# Patient Record
Sex: Female | Born: 1955 | Race: White | Hispanic: No | Marital: Married | Smoking: Never smoker
Health system: Southern US, Community
[De-identification: ages and names within clinical notes are randomized; demographics above are authoritative.]

## PROBLEM LIST (undated history)

## (undated) HISTORY — PX: BUNIONECTOMY: SHX129

## (undated) HISTORY — PX: DILATION AND CURETTAGE OF UTERUS: SHX78

## (undated) HISTORY — PX: OTHER SURGICAL HISTORY: SHX169

## (undated) HISTORY — PX: ABDOMINAL HYSTERECTOMY: SHX81

## (undated) HISTORY — PX: BREAST BIOPSY: SHX20

---

## 1999-09-28 ENCOUNTER — Observation Stay (HOSPITAL_COMMUNITY): Admission: RE | Admit: 1999-09-28 | Discharge: 1999-09-29 | Payer: Self-pay | Admitting: *Deleted

## 2003-03-28 LAB — HM DEXA SCAN: HM DEXA SCAN: NORMAL

## 2004-01-09 ENCOUNTER — Ambulatory Visit: Payer: Self-pay | Admitting: General Surgery

## 2004-03-17 ENCOUNTER — Ambulatory Visit: Payer: Self-pay

## 2004-06-04 ENCOUNTER — Ambulatory Visit: Payer: Self-pay | Admitting: General Surgery

## 2005-11-01 DIAGNOSIS — Z8711 Personal history of peptic ulcer disease: Secondary | ICD-10-CM | POA: Insufficient documentation

## 2006-02-08 ENCOUNTER — Ambulatory Visit: Payer: Self-pay

## 2007-04-13 ENCOUNTER — Ambulatory Visit: Payer: Self-pay | Admitting: General Surgery

## 2007-04-17 ENCOUNTER — Ambulatory Visit: Payer: Self-pay | Admitting: General Surgery

## 2007-11-22 ENCOUNTER — Ambulatory Visit: Payer: Self-pay

## 2009-02-20 ENCOUNTER — Ambulatory Visit: Payer: Self-pay | Admitting: General Surgery

## 2009-02-20 LAB — HM COLONOSCOPY: HM Colonoscopy: NORMAL

## 2010-06-23 ENCOUNTER — Ambulatory Visit: Payer: Self-pay

## 2011-08-29 ENCOUNTER — Emergency Department: Payer: Self-pay | Admitting: Emergency Medicine

## 2011-08-30 LAB — URINALYSIS, COMPLETE
Bilirubin,UR: NEGATIVE
Ketone: NEGATIVE
Ph: 5 (ref 4.5–8.0)
Squamous Epithelial: NONE SEEN

## 2012-01-17 ENCOUNTER — Emergency Department (HOSPITAL_COMMUNITY)
Admission: EM | Admit: 2012-01-17 | Discharge: 2012-01-17 | Disposition: A | Discharge status: Home / Self Care | Payer: BC Managed Care – PPO | Attending: Emergency Medicine | Admitting: Emergency Medicine

## 2012-01-17 ENCOUNTER — Encounter (HOSPITAL_COMMUNITY): Payer: Self-pay | Admitting: *Deleted

## 2012-01-17 ENCOUNTER — Emergency Department (HOSPITAL_COMMUNITY): Payer: BC Managed Care – PPO

## 2012-01-17 DIAGNOSIS — R55 Syncope and collapse: Secondary | ICD-10-CM | POA: Insufficient documentation

## 2012-01-17 DIAGNOSIS — R5381 Other malaise: Secondary | ICD-10-CM | POA: Insufficient documentation

## 2012-01-17 DIAGNOSIS — Z79899 Other long term (current) drug therapy: Secondary | ICD-10-CM | POA: Insufficient documentation

## 2012-01-17 DIAGNOSIS — R0602 Shortness of breath: Secondary | ICD-10-CM | POA: Insufficient documentation

## 2012-01-17 DIAGNOSIS — R110 Nausea: Secondary | ICD-10-CM | POA: Insufficient documentation

## 2012-01-17 DIAGNOSIS — K219 Gastro-esophageal reflux disease without esophagitis: Secondary | ICD-10-CM | POA: Insufficient documentation

## 2012-01-17 DIAGNOSIS — H538 Other visual disturbances: Secondary | ICD-10-CM | POA: Insufficient documentation

## 2012-01-17 LAB — CBC WITH DIFFERENTIAL/PLATELET
Basophils Absolute: 0 10*3/uL (ref 0.0–0.1)
Basophils Relative: 0 % (ref 0–1)
Eosinophils Absolute: 0.1 10*3/uL (ref 0.0–0.7)
Eosinophils Relative: 2 % (ref 0–5)
Lymphocytes Relative: 36 % (ref 12–46)
MCHC: 34.2 g/dL (ref 30.0–36.0)
MCV: 84.1 fL (ref 78.0–100.0)
Platelets: 282 10*3/uL (ref 150–400)
RDW: 13.5 % (ref 11.5–15.5)
WBC: 4.4 10*3/uL (ref 4.0–10.5)

## 2012-01-17 LAB — URINALYSIS, ROUTINE W REFLEX MICROSCOPIC
Bilirubin Urine: NEGATIVE
Hgb urine dipstick: NEGATIVE
Ketones, ur: NEGATIVE mg/dL
Protein, ur: NEGATIVE mg/dL
Urobilinogen, UA: 0.2 mg/dL (ref 0.0–1.0)

## 2012-01-17 LAB — COMPREHENSIVE METABOLIC PANEL
Albumin: 3.8 g/dL (ref 3.5–5.2)
Alkaline Phosphatase: 96 U/L (ref 39–117)
BUN: 13 mg/dL (ref 6–23)
Chloride: 103 mEq/L (ref 96–112)
Glucose, Bld: 121 mg/dL — ABNORMAL HIGH (ref 70–99)
Potassium: 3.7 mEq/L (ref 3.5–5.1)
Total Bilirubin: 0.4 mg/dL (ref 0.3–1.2)

## 2012-01-17 LAB — URINE MICROSCOPIC-ADD ON

## 2012-01-17 LAB — D-DIMER, QUANTITATIVE: D-Dimer, Quant: <0.27 ug/mL-FEU (ref 0.00–0.48)

## 2012-01-17 MED ORDER — ALUM & MAG HYDROXIDE-SIMETH 200-200-20 MG/5ML PO SUSP
30.0000 mL | Freq: Once | ORAL | Status: AC
  Administered 2012-01-17: 30 mL via ORAL
  Filled 2012-01-17: qty 30

## 2012-01-17 MED ORDER — ESOMEPRAZOLE MAGNESIUM 40 MG PO CPDR: 40.0000 mg | DELAYED_RELEASE_CAPSULE | Freq: Every day | ORAL | Status: DC

## 2012-01-17 NOTE — ED Provider Notes (Signed)
History     CSN: [id number]  Arrival date & time 01/17/12  1002   First MD Initiated Contact with Patient 01/17/12 1028      Chief Complaint  Patient presents with  . Near Syncope  . Weakness     HPI Pt states today around 9:30 am, felt like someone hit her in the head, became dizzy, weak, blurred vision, nauseous, and felt like she was going to pass out, states had has some tingling down L arm on and off, states had some shortness of breath on way to hospital.   History reviewed. No pertinent past medical history.  Past Surgical History  Procedure Date  . Abdominal hysterectomy   . Hemrroidectomy     History reviewed. No pertinent family history.  History  Substance Use Topics  . Smoking status: Never Smoker   . Smokeless tobacco: Never Used  . Alcohol Use: Yes    OB History    Grav Para Term Preterm Abortions TAB SAB Ect Mult Living                  Review of Systems All other systems reviewed and are negative Allergies  Erythromycin and Sulfa antibiotics  Home Medications   Current Outpatient Rx  Name  Route  Sig  Dispense  Refill  . CETIRIZINE HCL 10 MG PO TABS   Oral   Take 10 mg by mouth at bedtime.         Marland Kitchen ESOMEPRAZOLE MAGNESIUM 40 MG PO CPDR   Oral   Take 40 mg by mouth daily as needed. For indigestion.         . FLUOXETINE HCL 10 MG PO CAPS   Oral   Take 10 mg by mouth daily.         . ADULT MULTIVITAMIN W/MINERALS CH   Oral   Take 1 tablet by mouth daily.         Marland Kitchen RASPBERRY PO   Oral   Take 1 tablet by mouth daily.         Marland Kitchen ESOMEPRAZOLE MAGNESIUM 40 MG PO CPDR   Oral   Take 1 capsule (40 mg total) by mouth daily.   30 capsule   0     BP 125/72  Pulse 75  Temp 98.4 F (36.9 C) (Oral)  Resp 18  SpO2 98%  Physical Exam  Nursing note and vitals reviewed. Constitutional: She is oriented to person, place, and time. She appears well-developed and well-nourished. No distress.  HENT:  Head: Normocephalic and  atraumatic.  Eyes: Pupils are equal, round, and reactive to light.  Neck: Normal range of motion.  Cardiovascular: Normal rate and intact distal pulses.   Pulmonary/Chest: No respiratory distress.  Abdominal: Normal appearance. She exhibits no distension.  Musculoskeletal: Normal range of motion.  Neurological: She is alert and oriented to person, place, and time. She has normal strength. No cranial nerve deficit or sensory deficit. GCS eye subscore is 4. GCS verbal subscore is 5. GCS motor subscore is 6.  Skin: Skin is warm and dry. No rash noted.  Psychiatric: She has a normal mood and affect. Her behavior is normal.    ED Course  Procedures (including critical care time)  Date: 01/17/2012  Rate: 92  Rhythm: normal sinus rhythm  QRS Axis: normal  Intervals: normal  ST/T Wave abnormalities: normal  Conduction Disutrbances: none  Narrative Interpretation: unremarkable   Medications  FLUoxetine (PROZAC) 10 MG capsule (not administered)  RASPBERRY PO (not  administered)  Multiple Vitamin (MULTIVITAMIN WITH MINERALS) TABS (not administered)  cetirizine (ZYRTEC) 10 MG tablet (not administered)  esomeprazole (NEXIUM) 40 MG capsule (not administered)  esomeprazole (NEXIUM) 40 MG capsule (not administered)  alum & mag hydroxide-simeth (MAALOX/MYLANTA) 200-200-20 MG/5ML suspension 30 mL (30 mL Oral Given 01/17/12 1222)     Labs Reviewed  COMPREHENSIVE METABOLIC PANEL - Abnormal; Notable for the following:    Glucose, Bld 121 (*)     GFR calc non Af Amer 80 (*)     All other components within normal limits  GLUCOSE, CAPILLARY - Abnormal; Notable for the following:    Glucose-Capillary 113 (*)     All other components within normal limits  URINALYSIS, ROUTINE W REFLEX MICROSCOPIC - Abnormal; Notable for the following:    Leukocytes, UA TRACE (*)     All other components within normal limits  URINE MICROSCOPIC-ADD ON - Abnormal; Notable for the following:    Squamous Epithelial /  LPF FEW (*)     Bacteria, UA FEW (*)     All other components within normal limits  CBC WITH DIFFERENTIAL  D-DIMER, QUANTITATIVE  POCT PREGNANCY, URINE  POCT I-STAT TROPONIN I   Dg Chest 2 View  01/17/2012  *RADIOLOGY REPORT*  Clinical Data: Dizziness and weakness.  CHEST - 2 VIEW  Comparison: None.  Findings: Lungs are clear.  Heart size is normal.  No pneumothorax or pleural fluid.  IMPRESSION: No acute disease.   Original Report Authenticated By: Holley Dexter, M.D.    Ct Head Wo Contrast  01/17/2012  *RADIOLOGY REPORT*  Clinical Data: Near-syncope.  Dizziness.  CT HEAD WITHOUT CONTRAST  Technique:  Contiguous axial images were obtained from the base of the skull through the vertex without contrast.  Comparison: None.  Findings: The brain appears normal without evidence of infarct, hemorrhage, mass lesion, mass effect, midline shift or abnormal extra-axial fluid collection.  No hydrocephalus or pneumocephalus. The calvarium is intact.  IMPRESSION: No acute finding.   Original Report Authenticated By: Holley Dexter, M.D.      1. Reflux   2. Near syncope       MDM  After treatment in the ED the patient feels back to baseline and wants to go home.        Nelia Shi, MD 01/17/12 [phone]

## 2012-01-17 NOTE — ED Notes (Signed)
Pt states today around 9:30 am, felt like someone hit her in the head, became dizzy, weak, blurred vision, nauseous, and felt like she was going to pass out, states had has some tingling down L arm on and off, states had some shortness of breath on way to hospital.

## 2012-11-16 LAB — HM HEPATITIS C SCREENING LAB: HM HEPATITIS C SCREENING: NEGATIVE

## 2013-02-22 ENCOUNTER — Encounter: Payer: Self-pay | Admitting: Podiatry

## 2013-02-22 ENCOUNTER — Ambulatory Visit (INDEPENDENT_AMBULATORY_CARE_PROVIDER_SITE_OTHER): Payer: BC Managed Care – PPO

## 2013-02-22 ENCOUNTER — Ambulatory Visit (INDEPENDENT_AMBULATORY_CARE_PROVIDER_SITE_OTHER): Payer: BC Managed Care – PPO | Admitting: Podiatry

## 2013-02-22 VITALS — BP 127/76 | HR 101 | Resp 16 | Ht 67.0 in | Wt 145.0 lb

## 2013-02-22 DIAGNOSIS — M79609 Pain in unspecified limb: Secondary | ICD-10-CM

## 2013-02-22 DIAGNOSIS — M2040 Other hammer toe(s) (acquired), unspecified foot: Secondary | ICD-10-CM

## 2013-02-22 DIAGNOSIS — M79673 Pain in unspecified foot: Secondary | ICD-10-CM

## 2013-02-22 DIAGNOSIS — M779 Enthesopathy, unspecified: Secondary | ICD-10-CM

## 2013-02-22 DIAGNOSIS — L84 Corns and callosities: Secondary | ICD-10-CM

## 2013-02-22 MED ORDER — TRIAMCINOLONE ACETONIDE 10 MG/ML IJ SUSP
10.0000 mg | Freq: Once | INTRAMUSCULAR | Status: AC
  Administered 2013-02-22: 10 mg

## 2013-02-22 NOTE — Progress Notes (Signed)
Subjective:     Patient ID: Patty Spencer, female   DOB: October 11, 1955, 58 y.o.   MRN: [id number]  Toe Pain    patient presents stating I been having a lot of trouble with my fourth toe left over right for about the last 10 months. States it hurts in shoes and she had a trimmed once which gave minimal relief of her symptoms   Review of Systems  All other systems reviewed and are negative.       Objective:   Physical Exam  Nursing note and vitals reviewed. Constitutional: She is oriented to person, place, and time.  Cardiovascular: Intact distal pulses.   Musculoskeletal: Normal range of motion.  Neurological: She is oriented to person, place, and time.  Skin: Skin is warm.   neurovascular status intact with no health history changes and elevation of the fourth toe left over right with dorsal keratotic lesion at the interphalangeal joint proximal with fluid buildup noted within the joint surface. There has been previous arthroplasty performed fifth toe bilateral    Assessment:     Hammertoe deformity with elevated toe and capsulitis of the interphalangeal joint left over right    Plan:     H&P performed and conditions discussed. She wants as conservative as option as possible and today I didn't interphalangeal injection left 3 mg Bextra some Kenalog combination 5 mg Xylocaine debrided lesion and applied padding. Discussed possibility for tenotomy prior to consider bone surgery and we will see how she is doing in 8 weeks

## 2013-02-22 NOTE — Progress Notes (Signed)
   Subjective:    Patient ID: Patty Spencer, female    DOB: 1955-12-16, 58 y.o.   MRN: [id number]  HPI Comments: N pain L 4th digit left foot and right foot D 6 - 8 months O slowly C worse A shoes T different shoes, pt shaves toes, seen dr troxler and he trimmed 4th digit left, x-rays  Toe Pain       Review of Systems     Objective:   Physical Exam        Assessment & Plan:

## 2013-04-19 ENCOUNTER — Ambulatory Visit: Payer: BC Managed Care – PPO | Admitting: Podiatry

## 2013-10-07 IMAGING — CT CT HEAD W/O CM
2 series · 16 of 30 positions shown, 20 images · non-contrast
Comparison: None.

CLINICAL DATA: Near-syncope.  Dizziness.

CT HEAD WITHOUT CONTRAST
TECHNIQUE: Contiguous axial images were obtained from the base of
the skull through the vertex without contrast.

[Series 2: head w/o · axial · non-contrast · 0.43mm/px · z∈[-106,+14]mm · 13 of 29 slices shown, 17 images]
[im 3/29  brain]
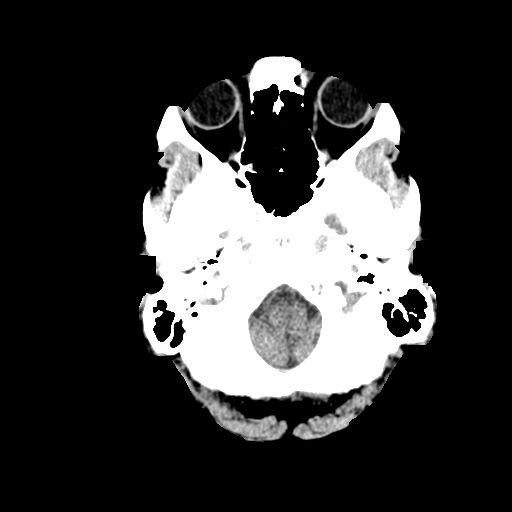
[im 3/29  bone]
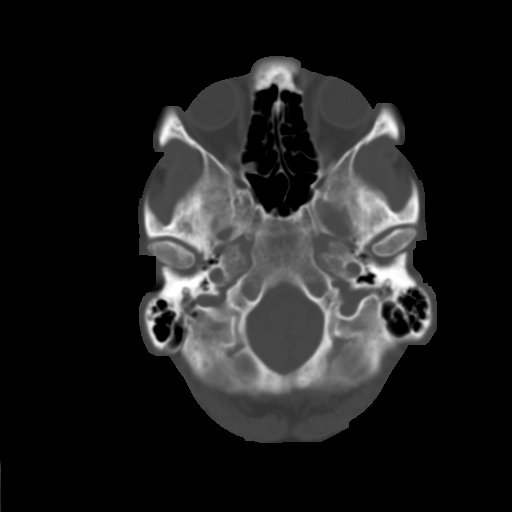
[im 5/29  brain]
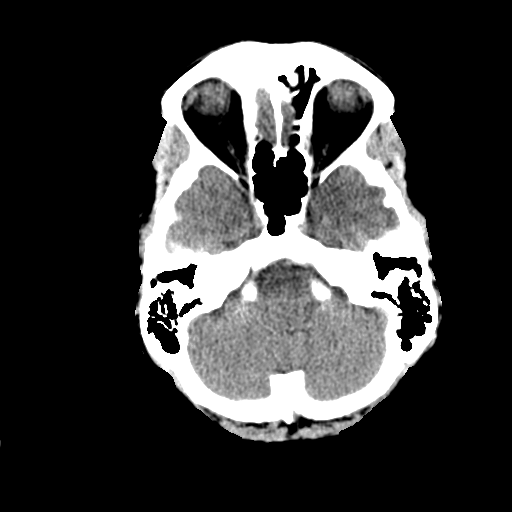
[im 7/29  brain]
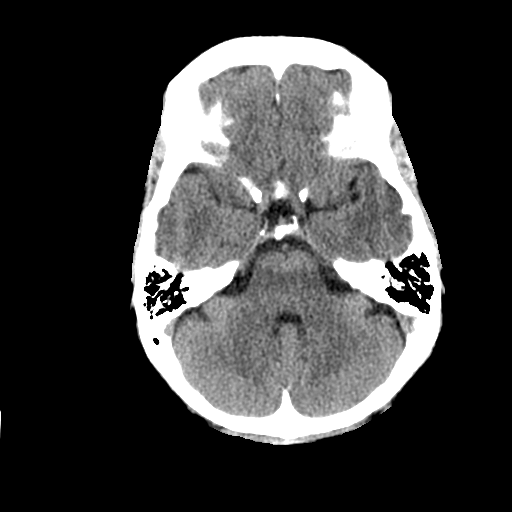
[im 9/29  brain]
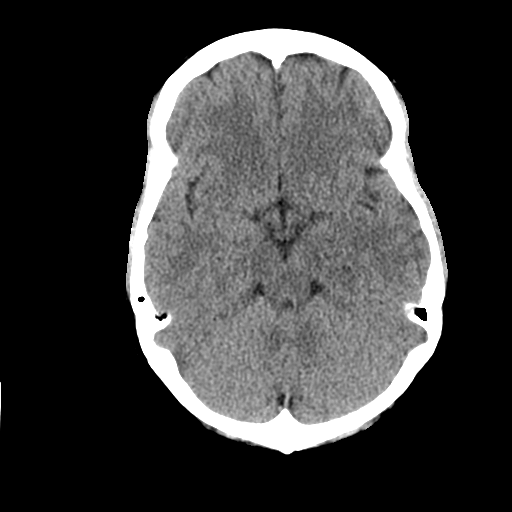
[im 11/29  brain]
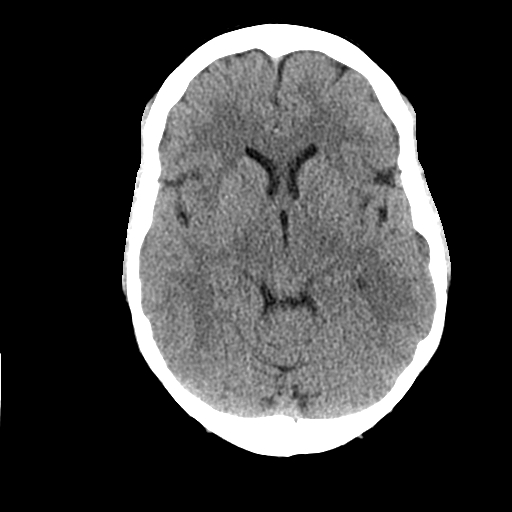
[im 11/29  bone]
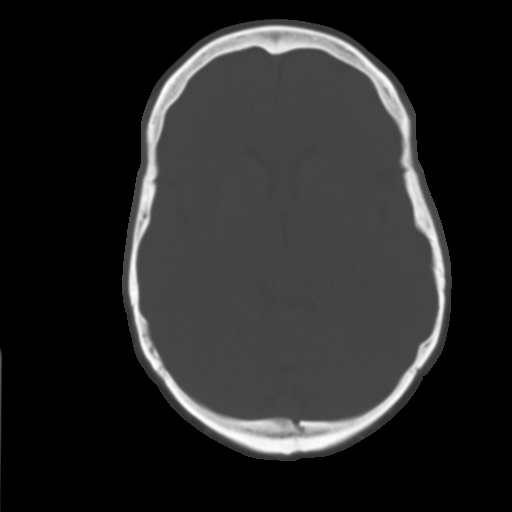
[im 13/29  brain]
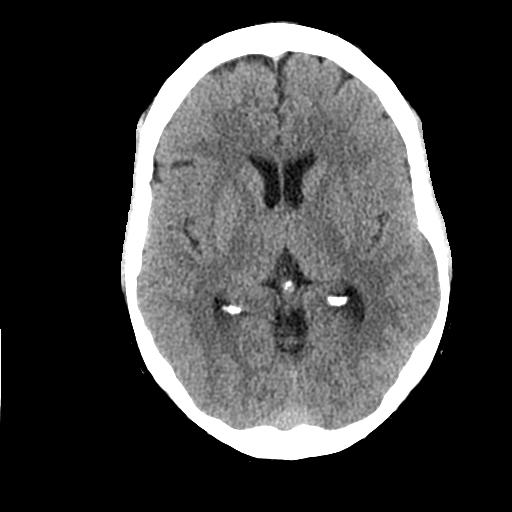
[im 15/29  brain]
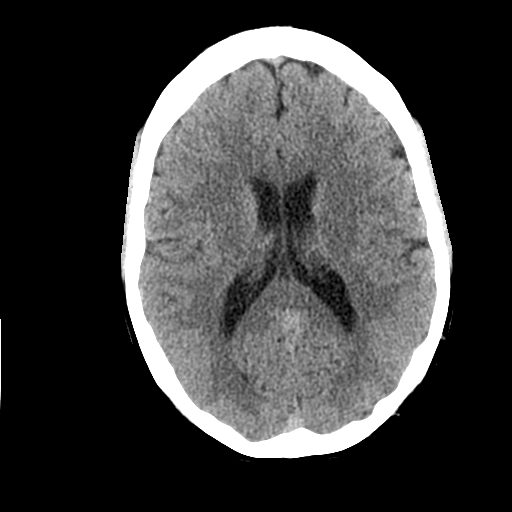
[im 17/29  brain]
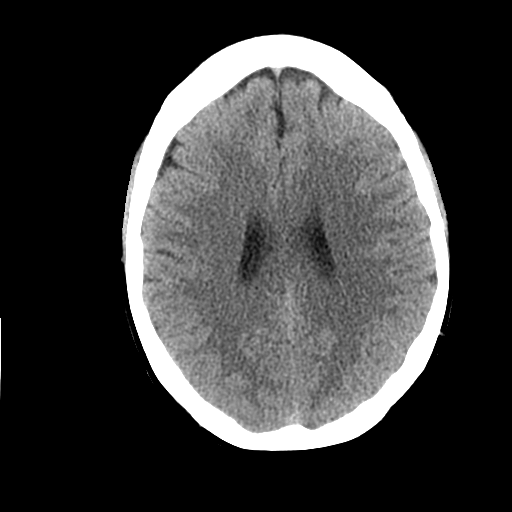
[im 19/29  brain]
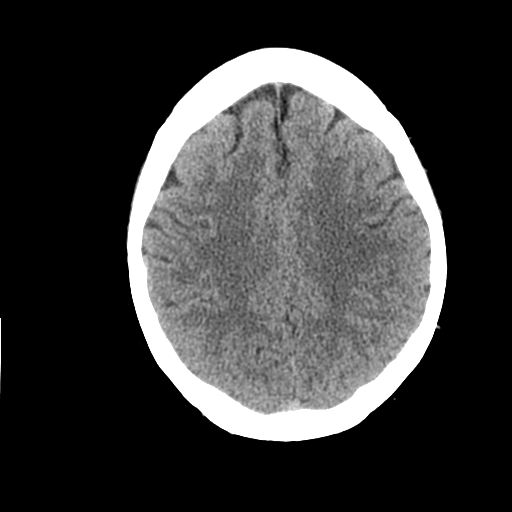
[im 19/29  bone]
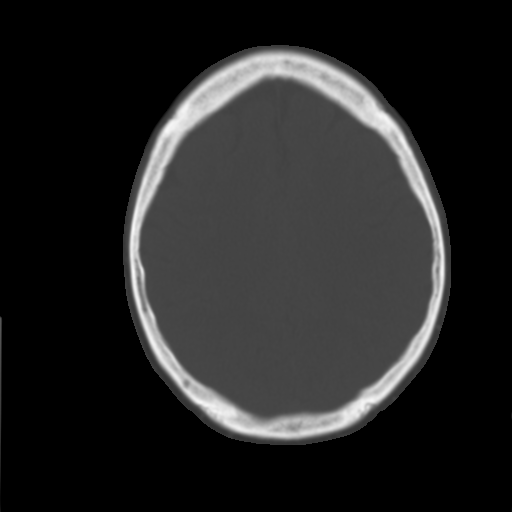
[im 21/29  brain]
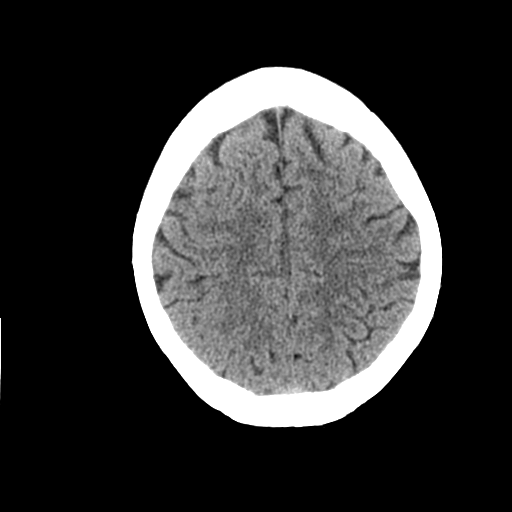
[im 23/29  brain]
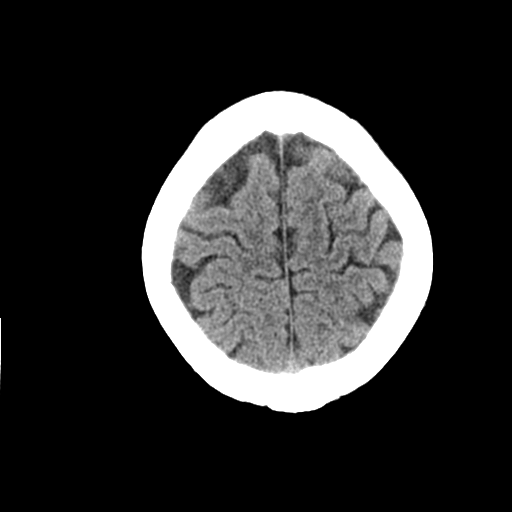
[im 25/29  brain]
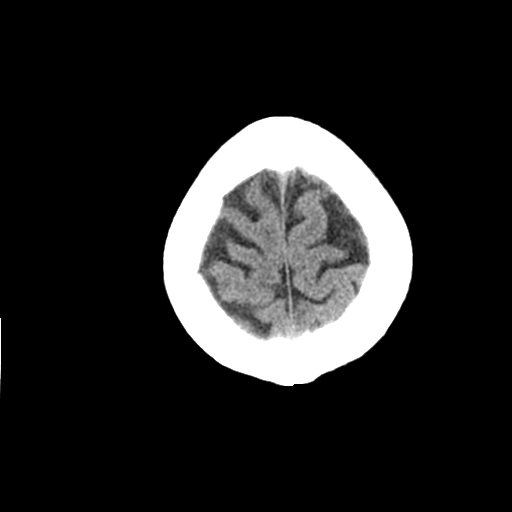
[im 27/29  brain]
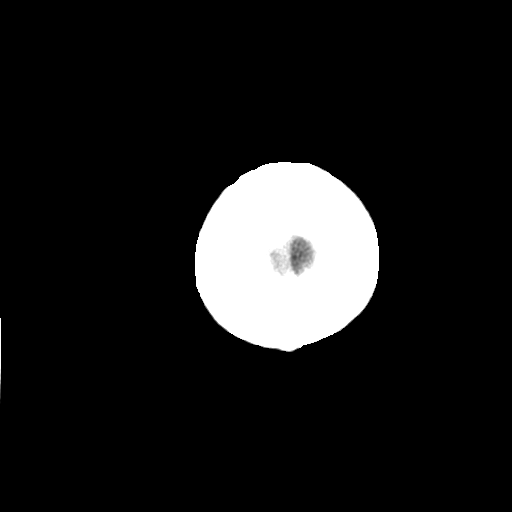
[im 27/29  bone]
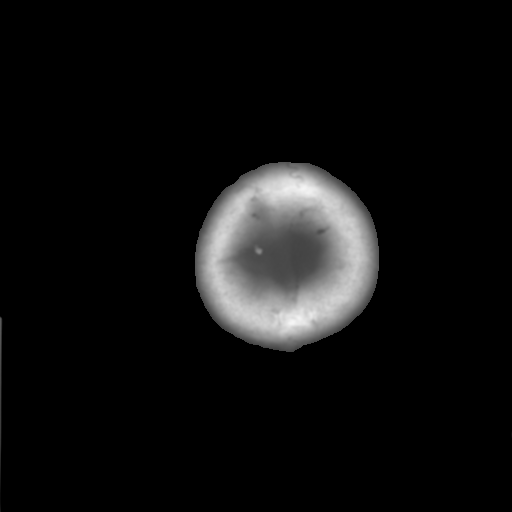

[Series 3: bone windows · axial · 0.43mm/px · z∈[-106,-66]mm · 3 of 29 slices shown]
[im 3/29  bone]
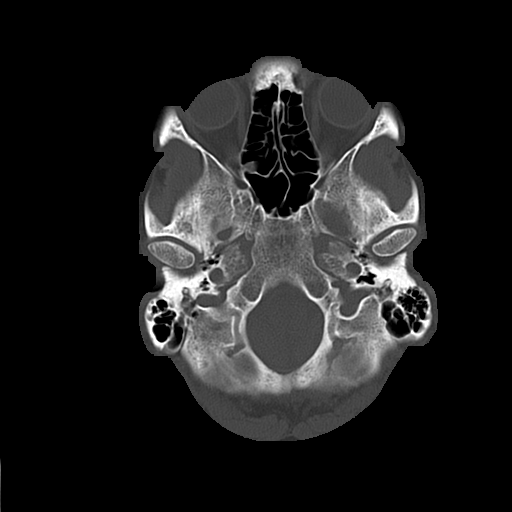
[im 7/29  bone]
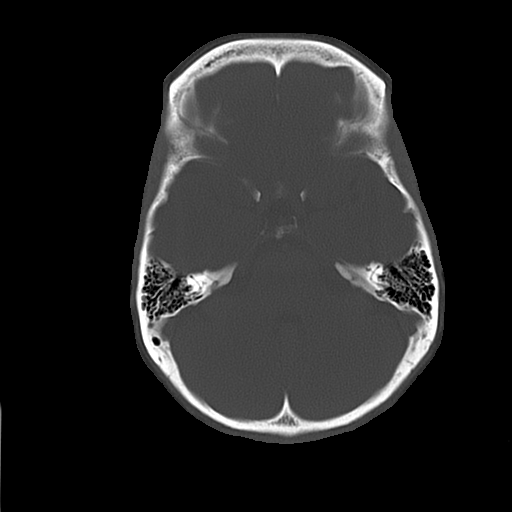
[im 11/29  bone]
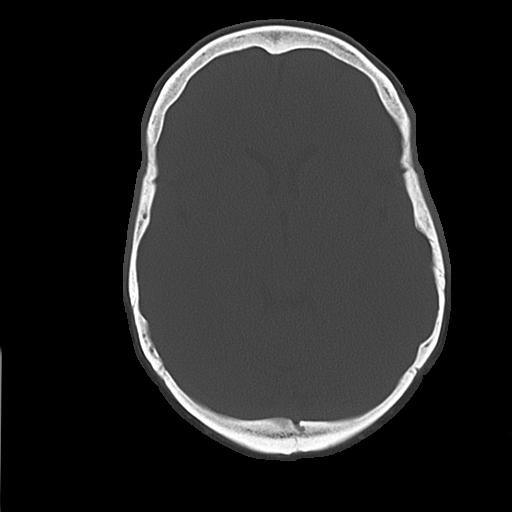

[16 of 30 positions shown; findings below may reference images not displayed]

FINDINGS: The brain appears normal without evidence of infarct,
hemorrhage, mass lesion, mass effect, midline shift or abnormal
extra-axial fluid collection.  No hydrocephalus or pneumocephalus.
The calvarium is intact.
IMPRESSION: No acute finding.

## 2013-12-06 LAB — CBC AND DIFFERENTIAL
HCT: 41 % (ref 36–46)
HEMOGLOBIN: 52 g/dL — AB (ref 12.0–16.0)
PLATELETS: 274 10*3/uL (ref 150–399)
WBC: 5.4 10^3/mL

## 2013-12-06 LAB — BASIC METABOLIC PANEL
BUN: 12 mg/dL (ref 4–21)
CREATININE: 0.9 mg/dL (ref <=1.1)
GLUCOSE: 89 mg/dL
POTASSIUM: 4.4 mmol/L (ref 3.4–5.3)
SODIUM: 144 mmol/L (ref 137–147)

## 2013-12-06 LAB — LIPID PANEL
Cholesterol: 253 mg/dL — AB (ref 0–200)
HDL: 61 mg/dL (ref 35–70)
LDL CALC: 171 mg/dL
LDl/HDL Ratio: 2.8
TRIGLYCERIDES: 107 mg/dL (ref 40–160)

## 2013-12-06 LAB — TSH: TSH: 1.82 u[IU]/mL (ref <=5.90)

## 2014-02-07 ENCOUNTER — Ambulatory Visit: Payer: Self-pay | Admitting: Family Medicine

## 2014-05-01 LAB — HEPATIC FUNCTION PANEL
ALT: 49 U/L — AB (ref 7–35)
AST: 30 U/L (ref 13–35)

## 2014-06-21 DIAGNOSIS — Z872 Personal history of diseases of the skin and subcutaneous tissue: Secondary | ICD-10-CM | POA: Insufficient documentation

## 2014-06-21 DIAGNOSIS — F32A Depression, unspecified: Secondary | ICD-10-CM | POA: Insufficient documentation

## 2014-06-21 DIAGNOSIS — G4700 Insomnia, unspecified: Secondary | ICD-10-CM | POA: Insufficient documentation

## 2014-06-21 DIAGNOSIS — R7989 Other specified abnormal findings of blood chemistry: Secondary | ICD-10-CM | POA: Insufficient documentation

## 2014-06-21 DIAGNOSIS — J309 Allergic rhinitis, unspecified: Secondary | ICD-10-CM | POA: Insufficient documentation

## 2014-06-21 DIAGNOSIS — F329 Major depressive disorder, single episode, unspecified: Secondary | ICD-10-CM | POA: Insufficient documentation

## 2014-06-21 DIAGNOSIS — Z780 Asymptomatic menopausal state: Secondary | ICD-10-CM | POA: Insufficient documentation

## 2014-06-21 DIAGNOSIS — F419 Anxiety disorder, unspecified: Secondary | ICD-10-CM

## 2014-06-21 DIAGNOSIS — R945 Abnormal results of liver function studies: Secondary | ICD-10-CM | POA: Insufficient documentation

## 2014-06-21 DIAGNOSIS — K219 Gastro-esophageal reflux disease without esophagitis: Secondary | ICD-10-CM | POA: Insufficient documentation

## 2014-06-21 DIAGNOSIS — E785 Hyperlipidemia, unspecified: Secondary | ICD-10-CM | POA: Insufficient documentation

## 2014-06-21 DIAGNOSIS — Z332 Encounter for elective termination of pregnancy: Secondary | ICD-10-CM | POA: Insufficient documentation

## 2014-08-06 ENCOUNTER — Other Ambulatory Visit: Payer: Self-pay

## 2014-08-06 MED ORDER — FLUOXETINE HCL 10 MG PO CAPS: 10.0000 mg | ORAL_CAPSULE | Freq: Every day | ORAL | Status: DC

## 2014-08-06 MED ORDER — ESOMEPRAZOLE MAGNESIUM 40 MG PO CPDR: 40.0000 mg | DELAYED_RELEASE_CAPSULE | Freq: Every day | ORAL | Status: DC

## 2014-08-14 ENCOUNTER — Ambulatory Visit: Payer: Self-pay | Admitting: Family Medicine

## 2014-08-18 ENCOUNTER — Ambulatory Visit (INDEPENDENT_AMBULATORY_CARE_PROVIDER_SITE_OTHER): Payer: BLUE CROSS/BLUE SHIELD | Admitting: Family Medicine

## 2014-08-18 ENCOUNTER — Encounter: Payer: Self-pay | Admitting: Family Medicine

## 2014-08-18 VITALS — BP 132/80 | HR 80 | Temp 98.0°F | Resp 16 | Wt 158.0 lb

## 2014-08-18 DIAGNOSIS — Z8711 Personal history of peptic ulcer disease: Secondary | ICD-10-CM

## 2014-08-18 DIAGNOSIS — F419 Anxiety disorder, unspecified: Secondary | ICD-10-CM

## 2014-08-18 DIAGNOSIS — F418 Other specified anxiety disorders: Secondary | ICD-10-CM

## 2014-08-18 DIAGNOSIS — K219 Gastro-esophageal reflux disease without esophagitis: Secondary | ICD-10-CM | POA: Diagnosis not present

## 2014-08-18 DIAGNOSIS — F329 Major depressive disorder, single episode, unspecified: Secondary | ICD-10-CM

## 2014-08-18 MED ORDER — OMEPRAZOLE 20 MG PO CPDR: 20.0000 mg | DELAYED_RELEASE_CAPSULE | Freq: Every day | ORAL | Status: DC

## 2014-08-18 NOTE — Progress Notes (Signed)
Patient ID: Patty Spencer, female   DOB: November 06, 1955, 59 y.o.   MRN: [id number]    Subjective:  HPI   Pt is here for a follow up after starting Prozac. She reports that emotionally she is doing great.However she is having some GERD sx again. She was taking Pantoprazole and she started noticing that she was having joint aches, swelling and sleeplessness. She thought it could be coming from the Pantoprazole since she looked at the side effects and those were some of them. She was not having any stomach problems at the time so she stopped it and the sx went away, so she has since started back on the Nexium that she has previously taken before. She reports that her stomach pain is still there and the Nexium does not seem to be helping as much. She would like to see what her other options are.    Prior to Admission medications   Medication Sig Start Date End Date Taking? Authorizing Provider  ALPRAZolam Prudy Feeler(XANAX) 0.5 MG tablet Take by mouth. 12/04/13  Yes Historical Provider, MD  cetirizine (ZYRTEC) 10 MG tablet Take 10 mg by mouth at bedtime.   Yes Historical Provider, MD  esomeprazole (NEXIUM) 40 MG capsule Take 1 capsule (40 mg total) by mouth daily. 08/06/14  Yes Richard Hulen ShoutsL Gilbert Jr., MD  FLUoxetine (PROZAC) 10 MG capsule Take 1 capsule (10 mg total) by mouth daily. 08/06/14  Yes Richard Hulen ShoutsL Gilbert Jr., MD  olopatadine (PATANOL) 0.1 % ophthalmic solution Apply to eye. 04/04/14  Yes Historical Provider, MD  Probiotic CAPS Take by mouth.   Yes Historical Provider, MD  ranitidine (ZANTAC) 150 MG tablet Take by mouth. 12/23/13  Yes Historical Provider, MD  Multiple Vitamin (MULTIVITAMIN WITH MINERALS) TABS Take 1 tablet by mouth daily.    Historical Provider, MD    Patient Active Problem List   Diagnosis Date Noted  . Allergic rhinitis 06/21/2014  . Anxiety and depression 06/21/2014  . Abnormal LFTs 06/21/2014  . Acid reflux 06/21/2014  . Personal history of disease of skin and subcutaneous tissue  06/21/2014  . Elective abortion 06/21/2014  . HLD (hyperlipidemia) 06/21/2014  . Cannot sleep 06/21/2014  . Asymptomatic postmenopausal status 06/21/2014  . H/O peptic ulcer 11/01/2005    History reviewed. No pertinent past medical history.  History   Social History  . Marital Status: Married    Spouse Name: N/A  . Number of Children: N/A  . Years of Education: N/A   Occupational History  . Not on file.   Social History Main Topics  . Smoking status: Never Smoker   . Smokeless tobacco: Never Used  . Alcohol Use: Yes  . Drug Use: No  . Sexual Activity: Yes   Other Topics Concern  . Not on file   Social History Narrative    Allergies  Allergen Reactions  . Codeine     hyperactivity  . Erythromycin Nausea And Vomiting  . Sulfa Antibiotics Rash    Review of Systems  Constitutional: Negative.   HENT: Negative.   Eyes: Negative.   Respiratory: Negative.   Cardiovascular: Negative.   Gastrointestinal: Positive for heartburn and abdominal pain.  Genitourinary: Negative.   Musculoskeletal: Negative.   Skin: Negative.   Neurological: Negative.   Endo/Heme/Allergies: Negative.   Psychiatric/Behavioral: Negative.     Immunization History  Administered Date(s) Administered  . Td 03/28/2003  . Tdap 09/28/2011   Objective:  BP 132/80 mmHg  Pulse 80  Temp(Src) 98 F (36.7 C) (Oral)  Resp 16  Wt 158 lb (71.668 kg)  Physical Exam  Constitutional: She is oriented to person, place, and time and well-developed, well-nourished, and in no distress.  HENT:  Head: Normocephalic and atraumatic.  Right Ear: External ear normal.  Left Ear: External ear normal.  Nose: Nose normal.  Eyes: Conjunctivae are normal.  Neck: Normal range of motion. Neck supple.  Cardiovascular: Normal rate, regular rhythm and normal heart sounds.   Pulmonary/Chest: Effort normal and breath sounds normal.  Abdominal: Soft. Bowel sounds are normal.  Neurological: She is alert and oriented  to person, place, and time.  Skin: Skin is warm and dry.  Psychiatric: Mood, memory, affect and judgment normal.    Lab Results  Component Value Date   WBC 5.4 12/06/2013   HGB 52.0* 12/06/2013   HCT 41 12/06/2013   PLT 274 12/06/2013   GLUCOSE 121* 01/17/2012   CHOL 253* 12/06/2013   TRIG 107 12/06/2013   HDL 61 12/06/2013   LDLCALC 171 12/06/2013   TSH 1.82 12/06/2013    CMP     Component Value Date/Time   NA 144 12/06/2013   NA 138 01/17/2012 1050   K 4.4 12/06/2013   CL 103 01/17/2012 1050   CO2 25 01/17/2012 1050   GLUCOSE 121* 01/17/2012 1050   BUN 12 12/06/2013   BUN 13 01/17/2012 1050   CREATININE 0.9 12/06/2013   CREATININE 0.81 01/17/2012 1050   CALCIUM 9.8 01/17/2012 1050   PROT 6.6 01/17/2012 1050   ALBUMIN 3.8 01/17/2012 1050   AST 30 05/01/2014   ALT 49* 05/01/2014   ALKPHOS 96 01/17/2012 1050   BILITOT 0.4 01/17/2012 1050   GFRNONAA 80* 01/17/2012 1050   GFRAA >90 01/17/2012 1050    Assessment and Plan :   1. Gastroesophageal reflux disease, esophagitis presence not specified Pt has failed Pantoprazole and Nexium. Will try Omeprazole and if not helping, will try Dexilant.   2. H/O peptic ulcer   3. Anxiety and depression Pt feeling much better on Prozac. Pt to consider taking lifelong.   I have done the exam and reviewed the above chart and it is accurate to the best of my knowledge.  Patient was seen and examined by Dr. Julieanne Manson, and noted scribed by Dimas Chyle, CMA  Julieanne Manson MD [facility] 08/18/2014 4:32 PM

## 2014-08-28 ENCOUNTER — Other Ambulatory Visit: Payer: Self-pay

## 2014-08-28 MED ORDER — FLUOXETINE HCL 10 MG PO CAPS: 10.0000 mg | ORAL_CAPSULE | Freq: Every day | ORAL | Status: DC

## 2014-09-04 ENCOUNTER — Telehealth: Payer: Self-pay | Admitting: Family Medicine

## 2014-09-04 DIAGNOSIS — K219 Gastro-esophageal reflux disease without esophagitis: Secondary | ICD-10-CM

## 2014-09-04 MED ORDER — ESOMEPRAZOLE MAGNESIUM 40 MG PO CPDR: 40.0000 mg | DELAYED_RELEASE_CAPSULE | Freq: Every day | ORAL | Status: DC

## 2014-09-04 NOTE — Telephone Encounter (Signed)
Pt wants to change back to the nexium .  Please caller back.  [phone]  Michel Santee.

## 2014-09-04 NOTE — Telephone Encounter (Signed)
LMTCB ED 

## 2014-09-11 ENCOUNTER — Other Ambulatory Visit: Payer: Self-pay | Admitting: Family Medicine

## 2014-09-11 DIAGNOSIS — K219 Gastro-esophageal reflux disease without esophagitis: Secondary | ICD-10-CM

## 2014-09-11 NOTE — Telephone Encounter (Signed)
Patient is requesting refill on esomeprazole (NEXIUM) 40 MG capsule   [facility] in [place]   [phone] phone

## 2014-09-12 MED ORDER — ESOMEPRAZOLE MAGNESIUM 40 MG PO CPDR: 40.0000 mg | DELAYED_RELEASE_CAPSULE | Freq: Every day | ORAL | Status: DC

## 2014-09-12 NOTE — Telephone Encounter (Signed)
RX sent in-aa 

## 2014-11-19 ENCOUNTER — Ambulatory Visit (INDEPENDENT_AMBULATORY_CARE_PROVIDER_SITE_OTHER): Payer: BLUE CROSS/BLUE SHIELD | Admitting: Family Medicine

## 2014-11-19 ENCOUNTER — Encounter: Payer: Self-pay | Admitting: Family Medicine

## 2014-11-19 VITALS — BP 128/84 | HR 84 | Temp 98.6°F | Resp 14 | Ht 68.5 in | Wt 153.0 lb

## 2014-11-19 DIAGNOSIS — Z0000 Encounter for general adult medical examination without abnormal findings: Secondary | ICD-10-CM

## 2014-11-19 LAB — POCT URINALYSIS DIPSTICK
BILIRUBIN UA: NEGATIVE
Glucose, UA: NEGATIVE
Ketones, UA: NEGATIVE
LEUKOCYTES UA: NEGATIVE
NITRITE UA: NEGATIVE
PH UA: 5
Protein, UA: NEGATIVE
RBC UA: NEGATIVE
Spec Grav, UA: 1.015
Urobilinogen, UA: NEGATIVE

## 2014-11-19 NOTE — Progress Notes (Signed)
Patient ID: Patty Spencer Blanke, female   DOB: 1955-09-01, 59 y.o.   MRN: [id number]  Visit Date: 11/19/2014  Today's Provider: Megan Mansichard Gilbert Jr, MD   Chief Complaint  Patient presents with  . Annual Exam   Subjective:  Patty Spencer Kidney is a 59 y.o. female who presents today for health maintenance and complete physical. She feels well. She reports exercising not currently but she has been. She reports she is sleeping well.  LAST: Pap smear and mammogram was fall 2015 with gyn.  Colonoscopy 02/20/09 normal  Tdap 09/28/11  BMD 03/28/03 normal  EKG 09/28/11. She had flu shot in September through work.  Review of Systems  Constitutional: Negative.   HENT: Negative.   Eyes: Negative.   Respiratory: Negative.   Cardiovascular: Negative.   Gastrointestinal: Negative.   Endocrine: Negative.   Genitourinary: Negative.  Negative for vaginal bleeding.       , And vaginal dryness and occasional postcoital bleeding that she associates with the menopausal dryness.  Musculoskeletal: Negative.   Skin: Negative.   Allergic/Immunologic: Negative.   Neurological: Negative.   Hematological: Negative.   Psychiatric/Behavioral: Negative.     Social History   Social History  . Marital Status: Married    Spouse Name: N/A  . Number of Children: N/A  . Years of Education: N/A   Occupational History  . Not on file.   Social History Main Topics  . Smoking status: Never Smoker   . Smokeless tobacco: Never Used  . Alcohol Use: Yes  . Drug Use: No  . Sexual Activity: Yes   Other Topics Concern  . Not on file   Social History Narrative    Patient Active Problem List   Diagnosis Date Noted  . Allergic rhinitis 06/21/2014  . Anxiety and depression 06/21/2014  . Abnormal LFTs 06/21/2014  . Acid reflux 06/21/2014  . Personal history of disease of skin and subcutaneous tissue 06/21/2014  . Elective abortion 06/21/2014  . HLD (hyperlipidemia) 06/21/2014  . Cannot sleep 06/21/2014   . Asymptomatic postmenopausal status 06/21/2014  . H/O peptic ulcer 11/01/2005    Past Surgical History  Procedure Laterality Date  . Abdominal hysterectomy    . Hemrroidectomy    . Bunionectomy Bilateral   . Dilation and curettage of uterus      Her family history includes Allergic Disorder in her brother; Allergic rhinitis in her sister; Asthma in her sister; CAD in her maternal grandmother; Epilepsy in her brother; Heart attack in her maternal grandmother; Pulmonary fibrosis in her father; Skin cancer in her mother.    Outpatient Prescriptions Prior to Visit  Medication Sig Dispense Refill  . ALPRAZolam (XANAX) 0.5 MG tablet Take by mouth.    . cetirizine (ZYRTEC) 10 MG tablet Take 10 mg by mouth at bedtime.    Marland Kitchen. esomeprazole (NEXIUM) 40 MG capsule Take 1 capsule (40 mg total) by mouth daily. 90 capsule 3  . esomeprazole (NEXIUM) 40 MG capsule Take 1 capsule (40 mg total) by mouth daily. 90 capsule 3  . FLUoxetine (PROZAC) 10 MG capsule Take 1 capsule (10 mg total) by mouth daily. 90 capsule 3  . Multiple Vitamin (MULTIVITAMIN WITH MINERALS) TABS Take 1 tablet by mouth daily.    Marland Kitchen. olopatadine (PATANOL) 0.1 % ophthalmic solution Apply to eye.    . Probiotic CAPS Take by mouth.    . ranitidine (ZANTAC) 150 MG tablet Take by mouth.     No facility-administered medications prior to visit.    Patient  Care Team: Maple Hudson., MD as PCP - General (Family Medicine)     Objective:   Vitals:  Filed Vitals:   11/19/14 0912  BP: 128/84  Pulse: 84  Temp: 98.6 F (37 C)  Resp: 14  Height: 5' 8.5" (1.74 m)  Weight: 153 lb (69.4 kg)    Physical Exam  Constitutional: She is oriented to person, place, and time. She appears well-developed and well-nourished.  HENT:  Head: Normocephalic and atraumatic.  Right Ear: External ear normal.  Left Ear: External ear normal.  Nose: Nose normal.  Mouth/Throat: Oropharynx is clear and moist.  Eyes: Conjunctivae and EOM are  normal. Pupils are equal, round, and reactive to light.  Neck: Normal range of motion. Neck supple.  Cardiovascular: Normal rate, regular rhythm, normal heart sounds and intact distal pulses.   Pulmonary/Chest: Effort normal and breath sounds normal.  Abdominal: Soft. Bowel sounds are normal.  Musculoskeletal: Normal range of motion.  Neurological: She is alert and oriented to person, place, and time. She has normal reflexes.  Skin: Skin is warm and dry.  Psychiatric: She has a normal mood and affect. Her behavior is normal. Judgment and thought content normal.     Depression Screen PHQ 2/9 Scores 11/19/2014  PHQ - 2 Score 0      Assessment & Plan:   1. Annual physical exam Breast/pelvic/DRE per Gyn. - CBC with Differential/Platelet - Comprehensive metabolic panel - POCT urinalysis dipstick - Lipid Panel With LDL/HDL Ratio - TSH 2.Atrophic Vaginitis Try lubricant and discuss with Gyn 3.GERD Con trolled with Nexium. Will try to stop when she retires from work. 4.MDD Probable lifelong Prozac. ,I have done the exam and reviewed the above chart and it is accurate to the best of my knowledge.

## 2015-03-24 ENCOUNTER — Ambulatory Visit (INDEPENDENT_AMBULATORY_CARE_PROVIDER_SITE_OTHER): Payer: BLUE CROSS/BLUE SHIELD | Admitting: Sports Medicine

## 2015-03-24 ENCOUNTER — Ambulatory Visit (INDEPENDENT_AMBULATORY_CARE_PROVIDER_SITE_OTHER): Payer: BLUE CROSS/BLUE SHIELD

## 2015-03-24 ENCOUNTER — Encounter: Payer: Self-pay | Admitting: Sports Medicine

## 2015-03-24 DIAGNOSIS — M79671 Pain in right foot: Secondary | ICD-10-CM

## 2015-03-24 DIAGNOSIS — M2040 Other hammer toe(s) (acquired), unspecified foot: Secondary | ICD-10-CM

## 2015-03-24 DIAGNOSIS — M779 Enthesopathy, unspecified: Secondary | ICD-10-CM | POA: Diagnosis not present

## 2015-03-24 DIAGNOSIS — L84 Corns and callosities: Secondary | ICD-10-CM

## 2015-03-24 MED ORDER — TRIAMCINOLONE ACETONIDE 10 MG/ML IJ SUSP: 10.0000 mg | Freq: Once | INTRAMUSCULAR | Status: DC

## 2015-03-24 NOTE — Progress Notes (Signed)
Patient ID: Patty Spencer, female   DOB: July 24, 1955, 60 y.o.   MRN: [id number] Subjective: Patty Spencer is a 60 y.o. female patient who presents to office for evaluation of Right> Left foot pain. Patient complains of progressive pain especially over the last few weeks at the Right big toe; reports that she has callus on her toes but the right big toe callus throbs and started becoming more bothersome since she wore ill fitting shoes. Patient denies any other pedal complaints.   Patient Active Problem List   Diagnosis Date Noted  . Allergic rhinitis 06/21/2014  . Anxiety and depression 06/21/2014  . Abnormal LFTs 06/21/2014  . Acid reflux 06/21/2014  . Personal history of disease of skin and subcutaneous tissue 06/21/2014  . Elective abortion 06/21/2014  . HLD (hyperlipidemia) 06/21/2014  . Cannot sleep 06/21/2014  . Asymptomatic postmenopausal status 06/21/2014  . H/O peptic ulcer 11/01/2005   Current Outpatient Prescriptions on File Prior to Visit  Medication Sig Dispense Refill  . ALPRAZolam (XANAX) 0.5 MG tablet Take by mouth.    . cetirizine (ZYRTEC) 10 MG tablet Take 10 mg by mouth at bedtime.    Marland Kitchen esomeprazole (NEXIUM) 40 MG capsule Take 1 capsule (40 mg total) by mouth daily. 90 capsule 3  . esomeprazole (NEXIUM) 40 MG capsule Take 1 capsule (40 mg total) by mouth daily. 90 capsule 3  . FLUoxetine (PROZAC) 10 MG capsule Take 1 capsule (10 mg total) by mouth daily. 90 capsule 3  . Multiple Vitamin (MULTIVITAMIN WITH MINERALS) TABS Take 1 tablet by mouth daily.    Marland Kitchen olopatadine (PATANOL) 0.1 % ophthalmic solution Apply to eye.    . Probiotic CAPS Take by mouth.    . ranitidine (ZANTAC) 150 MG tablet Take by mouth.     No current facility-administered medications on file prior to visit.   Allergies  Allergen Reactions  . Erythromycin Base Other (See Comments)    No description of reaction noted  . Sulfamethoxazole Other (See Comments)    No description of  reactions noted  . Codeine Other (See Comments)    hyperactivity hyperactivity  . Erythromycin Nausea And Vomiting  . Ciprofloxacin Diarrhea  . Lauryl Glucoside Other (See Comments)    positive patch test  . Sulfa Antibiotics Rash    Objective:  General: Alert and oriented x3 in no acute distress  Dermatology: No open lesions bilateral lower extremities, + corn dorsal left 4th IPJ, right medial hallux IPJ, right 3-4 dorsal IPJ with no signs of infection, old scar at dorsal 5th toes from previous hammertoe surgery that is well healed, no webspace macerations, no ecchymosis bilateral, all nails x 10 are well manicured.  Vascular: Dorsalis Pedis and Posterior Tibial pedal pulses 2/4, Capillary Fill Time 3 seconds,(+) pedal hair growth bilateral, no edema bilateral lower extremities, Temperature gradient within normal limits.  Neurology: Michaell Cowing sensation intact via light touch bilateral.  Musculoskeletal: Mild tenderness with palpation at hallux IPJ with bone spur on Right,No pain with calf compression bilateral. Ankle and pedal joint range of motion within normal limits. + hammertoe deformity. Strength within normal limits in all groups bilateral.   Xrays  Right Foot    Impression:Normal osseous mineralization, hammertoe deformity, evidence of arthroplasty at 5th PIPJ, bone spur at medial hallux, posterior and inferior calcaneous with no fracture or dislocation, elongated 2nd metatarsal, soft tissues within normal limits, No foreign body.   Assessment and Plan: Problem List Items Addressed This Visit    None  Visit Diagnoses    Right foot pain    -  Primary    Relevant Orders    DG Foot 2 Views Right    Capsulitis        Relevant Medications    triamcinolone acetonide (KENALOG) 10 MG/ML injection 10 mg    Hammer toe, unspecified laterality        Corns and callosities           -Complete examination performed -Xrays reviewed -Discussed treatement options for capsulitis at  right hallux IPJ secondary to hammertoe deformity and corns; Conservative and Surgical Treatment options explained -After oral consent and aseptic prep, injected a mixture containing 1 ml of 2%  plain lidocaine, 1 ml 0.5% plain marcaine, 0.5 ml of kenalog 10 and 0.5 ml of dexamethasone phosphate into right hallux IPJ. Post-injection care discussed with patient.  -Gave patient silicone toe pads to use to right hallux daily -Recommend good supportive wider shoes to prevent irritation -Mechanically debrided corns x3 using sterile chisel blade without incident -Patient to return to office as needed or sooner if condition worsens.  Asencion Islam, DPM

## 2015-05-11 ENCOUNTER — Other Ambulatory Visit: Payer: Self-pay | Admitting: Family Medicine

## 2015-05-11 DIAGNOSIS — K219 Gastro-esophageal reflux disease without esophagitis: Secondary | ICD-10-CM

## 2015-05-11 MED ORDER — ESOMEPRAZOLE MAGNESIUM 40 MG PO CPDR: 40.0000 mg | DELAYED_RELEASE_CAPSULE | Freq: Every day | ORAL | Status: DC

## 2015-05-11 NOTE — Telephone Encounter (Signed)
Pt contacted office for refill request on the following medications: esomeprazole (NEXIUM) 40 MG capsule to [facility]. Pt stated she has a coupon and she [facility]. Please advise. Thanks TNP

## 2015-05-11 NOTE — Telephone Encounter (Signed)
Done-aa 

## 2015-08-20 ENCOUNTER — Other Ambulatory Visit: Payer: Self-pay

## 2015-08-20 NOTE — Telephone Encounter (Signed)
[facility] for refill on Fluoxetine-aa

## 2015-08-21 ENCOUNTER — Telehealth: Payer: Self-pay | Admitting: Family Medicine

## 2015-08-21 MED ORDER — FLUOXETINE HCL 10 MG PO CAPS: 10.0000 mg | ORAL_CAPSULE | Freq: Every day | ORAL | Status: DC

## 2015-08-21 NOTE — Telephone Encounter (Signed)
error 

## 2015-10-29 IMAGING — US US GALLBLADDER-BILIARY (RUQ)
1 series · 14 of 25 positions shown · non-contrast
Comparison: None.

CLINICAL DATA: Elevated liver function test.

EXAM:
US ABDOMEN LIMITED - RIGHT UPPER QUADRANT

[Series 1: us gallbladder-biliary (ruq) · 0.28mm/px · 14 of 58 slices shown]
[im 1/58]
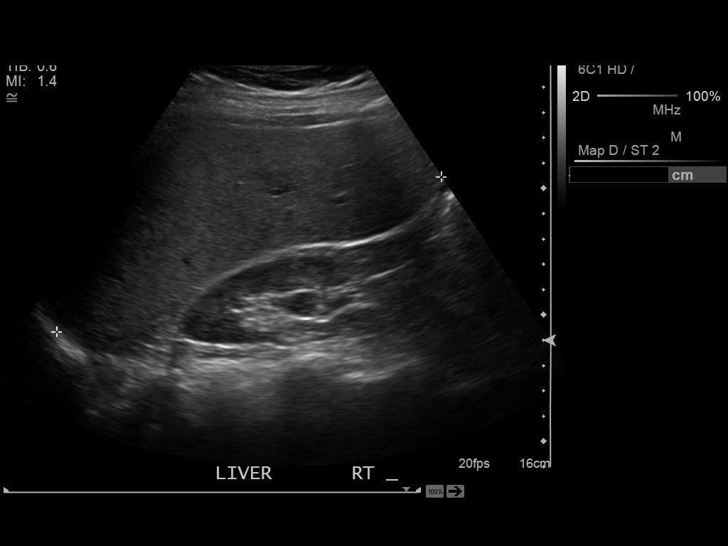
[im 5/58]
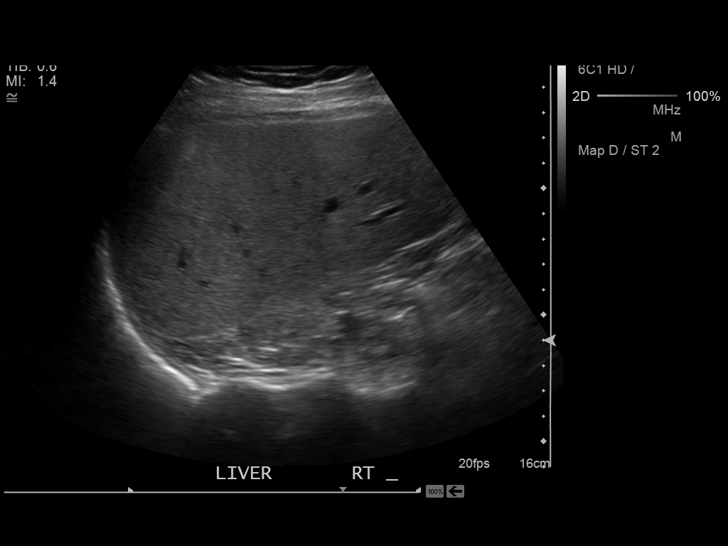
[im 10/58]
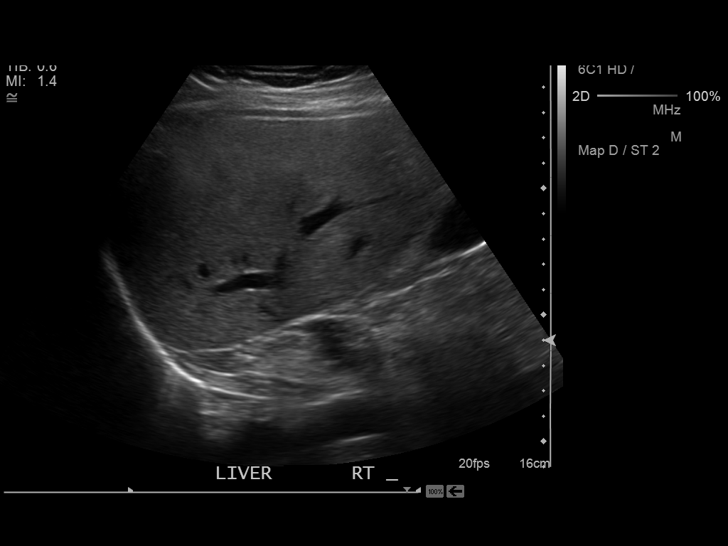
[im 15/58]
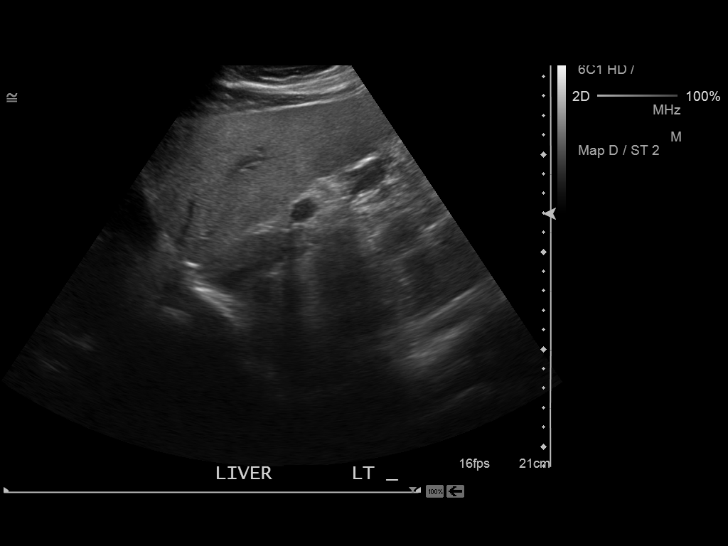
[im 20/58]
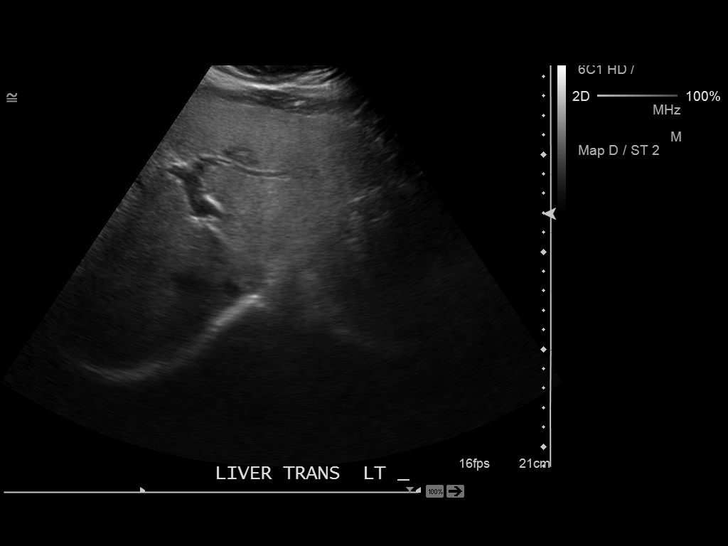
[im 22/58]
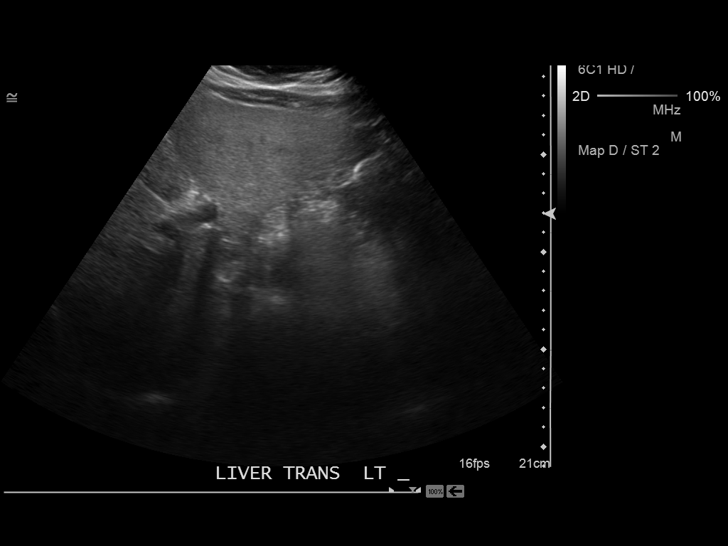
[im 27/58]
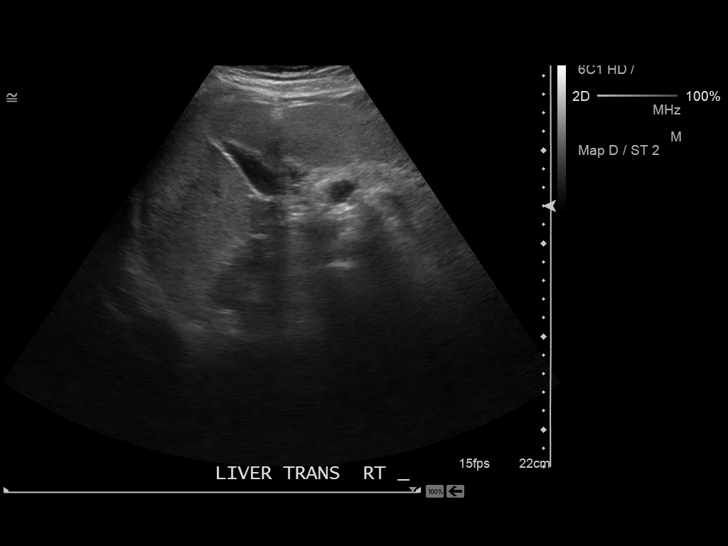
[im 31/58]
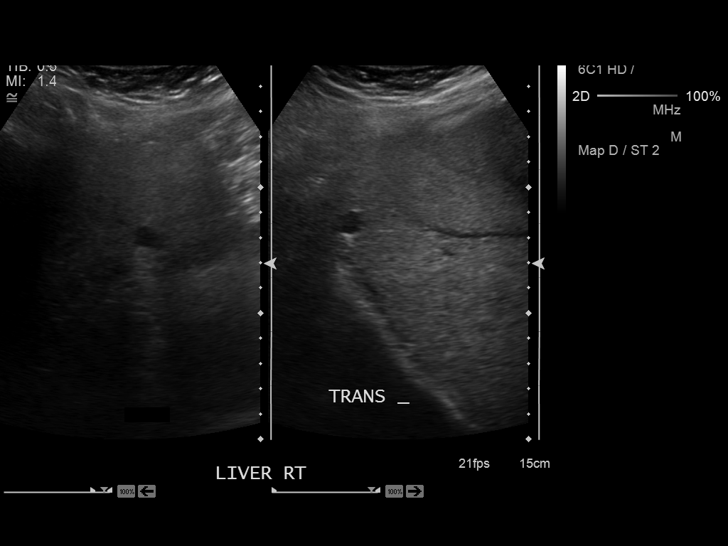
[im 36/58]
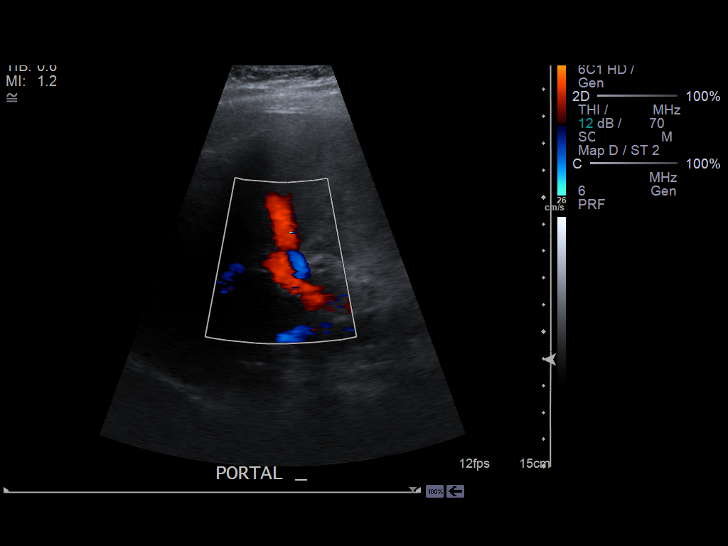
[im 39/58]
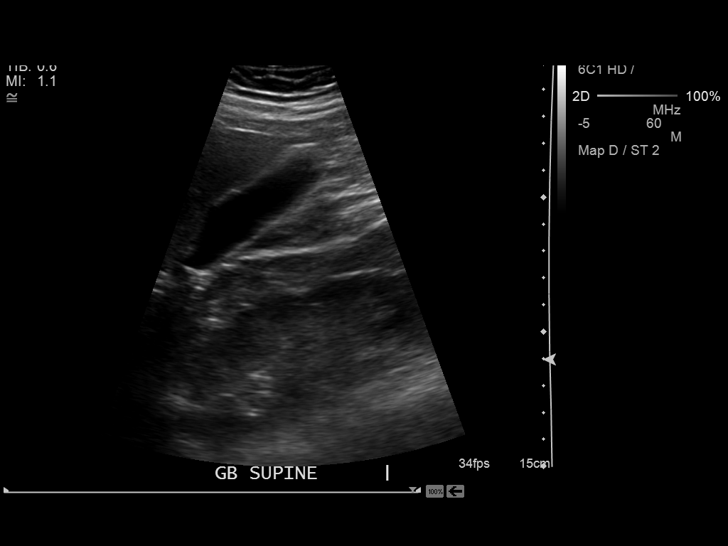
[im 43/58]
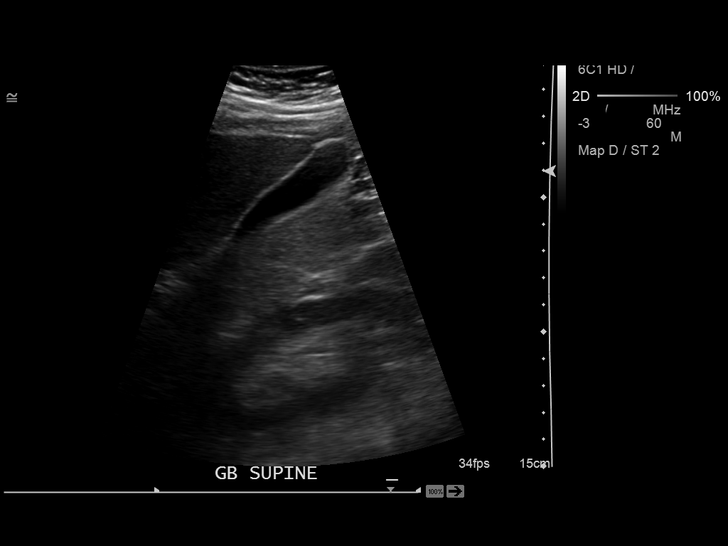
[im 48/58]
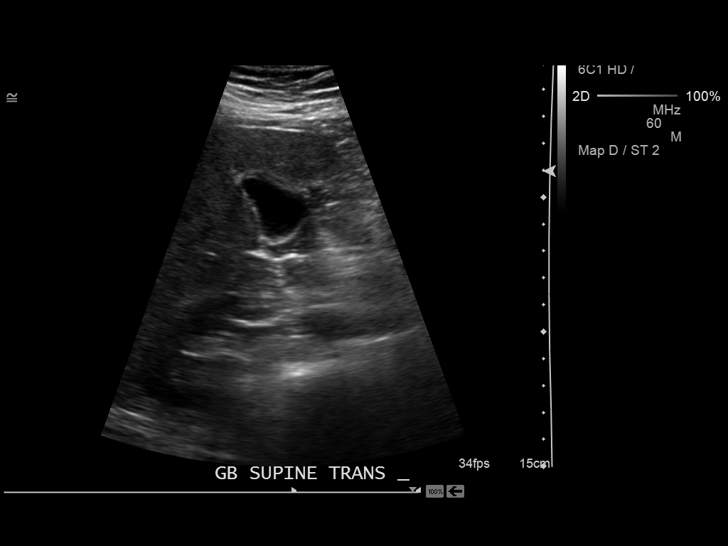
[im 53/58]
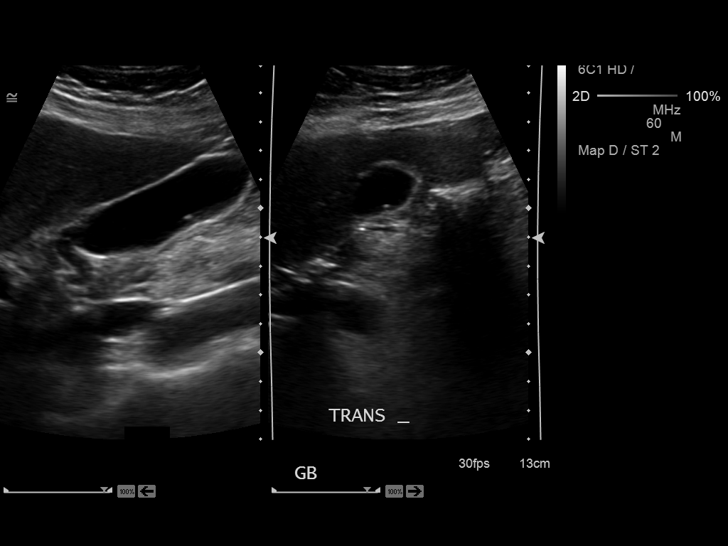
[im 58/58]
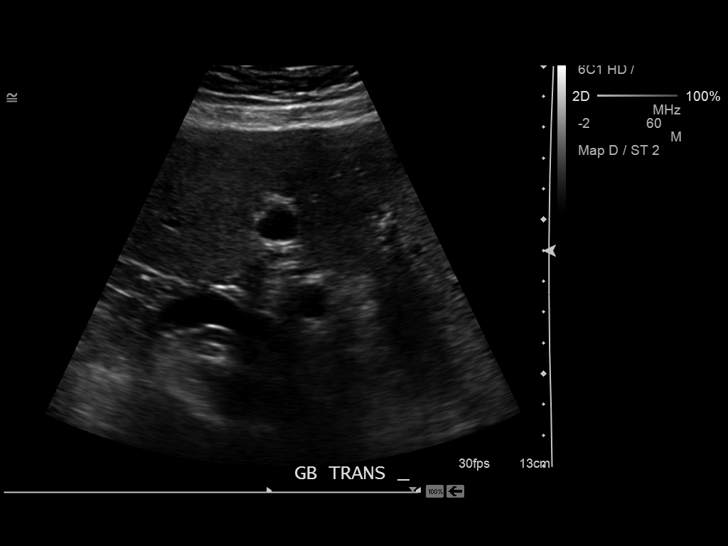

[14 of 25 positions shown; findings below may reference images not displayed]

FINDINGS: Gallbladder:

Tiny 3.5 mm polyp.No gallstones. Gallbladder wall thickness 1.6 mm.
Negative Murphy sign.

Common bile duct:

Diameter: 2.0 mm

Liver:

Liver slightly echogenic suggesting fatty infiltration. Two small
hepatic hypoechoic lesions with increased through transmission are
noted right hepatic lobe, the largest measures 1.5 cm. These are
most likely tiny cyst
IMPRESSION: 1. Tiny 3.5 mm gallbladder polyp.
2. Fatty infiltration of liver.  Tiny hepatic cysts.

## 2015-11-11 ENCOUNTER — Ambulatory Visit (INDEPENDENT_AMBULATORY_CARE_PROVIDER_SITE_OTHER): Payer: 59 | Admitting: Family Medicine

## 2015-11-11 ENCOUNTER — Encounter: Payer: Self-pay | Admitting: Family Medicine

## 2015-11-11 VITALS — BP 154/72 | HR 82 | Temp 98.4°F | Resp 16 | Ht 67.0 in | Wt 152.0 lb

## 2015-11-11 DIAGNOSIS — F32A Depression, unspecified: Secondary | ICD-10-CM

## 2015-11-11 DIAGNOSIS — F419 Anxiety disorder, unspecified: Secondary | ICD-10-CM

## 2015-11-11 DIAGNOSIS — E784 Other hyperlipidemia: Secondary | ICD-10-CM | POA: Diagnosis not present

## 2015-11-11 DIAGNOSIS — F329 Major depressive disorder, single episode, unspecified: Secondary | ICD-10-CM

## 2015-11-11 DIAGNOSIS — E7849 Other hyperlipidemia: Secondary | ICD-10-CM

## 2015-11-11 DIAGNOSIS — R030 Elevated blood-pressure reading, without diagnosis of hypertension: Secondary | ICD-10-CM

## 2015-11-11 DIAGNOSIS — F418 Other specified anxiety disorders: Secondary | ICD-10-CM | POA: Diagnosis not present

## 2015-11-11 DIAGNOSIS — Z0000 Encounter for general adult medical examination without abnormal findings: Secondary | ICD-10-CM | POA: Diagnosis not present

## 2015-11-11 LAB — POCT URINALYSIS DIPSTICK
BILIRUBIN UA: NEGATIVE
Blood, UA: NEGATIVE
Glucose, UA: NEGATIVE
KETONES UA: NEGATIVE
LEUKOCYTES UA: NEGATIVE
Nitrite, UA: NEGATIVE
PH UA: 5
Protein, UA: NEGATIVE
Spec Grav, UA: 1.01
Urobilinogen, UA: 0.2

## 2015-11-11 MED ORDER — FLUOXETINE HCL 10 MG PO CAPS: 10.0000 mg | ORAL_CAPSULE | Freq: Every day | ORAL | 3 refills | Status: DC

## 2015-11-11 MED ORDER — NEXIUM 40 MG PO CPDR: 40.0000 mg | DELAYED_RELEASE_CAPSULE | Freq: Every day | ORAL | 3 refills | Status: DC

## 2015-11-11 MED ORDER — ALPRAZOLAM 0.5 MG PO TABS: ORAL_TABLET | ORAL | 5 refills | Status: DC

## 2015-11-11 NOTE — Progress Notes (Signed)
Patient: Patty Spencer, Female    DOB: 21-Feb-1955, 60 y.o.   MRN: [id number] Visit Date: 11/11/2015  Today's Provider: Megan Mans, MD   Chief Complaint  Patient presents with  . Annual Exam   Subjective:  Patty Spencer is a 60 y.o. female who presents today for health maintenance and complete physical. She feels well. She reports exercising yes about 2 times a week walking on treadmill for about 30 minutes. She reports she is sleeping well. 2017 is been a stressful year as her mother-in-law just died this month and she and her husband moved in with her to take care of her it with terminal lung cancer. Immunization History  Administered Date(s) Administered  . Td 03/28/2003  . Tdap 09/28/2011   Last Mammogram 06/23/10 but patient states she  had it in 2015.  BMD 03/28/03 normal  Colonoscopy 02/20/09 normal  Pap smear with gynecologist per patient in 2015 and she has appointment set up with them.  Review of Systems  Constitutional: Negative.   HENT: Positive for sinus pressure.   Eyes: Negative.   Respiratory: Negative.   Cardiovascular: Negative.   Gastrointestinal: Negative.   Endocrine: Negative.   Genitourinary: Negative.   Musculoskeletal: Negative.   Skin: Negative.   Allergic/Immunologic: Negative.   Neurological: Negative.   Hematological: Negative.   Psychiatric/Behavioral: Negative.     Social History   Social History  . Marital status: Married    Spouse name: N/A  . Number of children: N/A  . Years of education: N/A   Occupational History  . Not on file.   Social History Main Topics  . Smoking status: Never Smoker  . Smokeless tobacco: Never Used  . Alcohol use Yes     Comment: socially  . Drug use: No  . Sexual activity: Yes   Other Topics Concern  . Not on file   Social History Narrative  . No narrative on file    Patient Active Problem List   Diagnosis Date Noted  . Allergic rhinitis 06/21/2014  . Anxiety and depression  06/21/2014  . Abnormal LFTs 06/21/2014  . Acid reflux 06/21/2014  . Personal history of disease of skin and subcutaneous tissue 06/21/2014  . Elective abortion 06/21/2014  . HLD (hyperlipidemia) 06/21/2014  . Cannot sleep 06/21/2014  . Asymptomatic postmenopausal status 06/21/2014  . H/O peptic ulcer 11/01/2005    Past Surgical History:  Procedure Laterality Date  . ABDOMINAL HYSTERECTOMY    . BUNIONECTOMY Bilateral   . DILATION AND CURETTAGE OF UTERUS    . hemrroidectomy      Her family history includes Allergic Disorder in her brother; Allergic rhinitis in her sister; Asthma in her sister; CAD in her maternal grandmother; Epilepsy in her brother; Heart attack in her maternal grandmother; Pulmonary fibrosis in her father; Skin cancer in her mother.    Outpatient Encounter Prescriptions as of 11/11/2015  Medication Sig Note  . ALPRAZolam (XANAX) 0.5 MG tablet Take by mouth at bedtime as needed.  06/21/2014: Received from: [facility]  . cetirizine (ZYRTEC) 10 MG tablet Take 10 mg by mouth at bedtime.   Marland Kitchen esomeprazole (NEXIUM) 40 MG capsule Take 1 capsule (40 mg total) by mouth daily.   Marland Kitchen FLUoxetine (PROZAC) 10 MG capsule Take 1 capsule (10 mg total) by mouth daily.   Marland Kitchen FLUARIX QUADRIVALENT 0.5 ML injection  11/11/2015: Received from: External Pharmacy  . [DISCONTINUED] esomeprazole (NEXIUM) 40 MG capsule Take 1 capsule (40 mg total) by mouth daily.   . [  DISCONTINUED] Multiple Vitamin (MULTIVITAMIN WITH MINERALS) TABS Take 1 tablet by mouth daily.   . [DISCONTINUED] olopatadine (PATANOL) 0.1 % ophthalmic solution Apply to eye. 06/21/2014: Medication taken as needed.  [facility]  . [DISCONTINUED] Probiotic CAPS Take by mouth. 06/21/2014: Received from: [facility]  . [DISCONTINUED] ranitidine (ZANTAC) 150 MG tablet Take by mouth. 06/21/2014: Received from: [facility]  . [DISCONTINUED] triamcinolone  acetonide (KENALOG) 10 MG/ML injection 10 mg     No facility-administered encounter medications on file as of 11/11/2015.     Patient Care Team: Maple Hudsonichard L Bubba Vanbenschoten Jr., MD as PCP - General (Family Medicine)     Objective:   Vitals:  Vitals:   11/11/15 0928  BP: (!) 154/72  Pulse: 82  Resp: 16  Temp: 98.4 F (36.9 C)  Weight: 152 lb (68.9 kg)  Height: 5\' 7"  (1.702 m)    Physical Exam  Constitutional: She is oriented to person, place, and time. She appears well-developed and well-nourished.  HENT:  Head: Normocephalic and atraumatic.  Right Ear: External ear normal.  Left Ear: External ear normal.  Nose: Nose normal.  Mouth/Throat: Oropharynx is clear and moist.  Eyes: Conjunctivae are normal. Pupils are equal, round, and reactive to light. No scleral icterus.  Neck: Normal range of motion. Neck supple. No thyromegaly present.  Cardiovascular: Normal rate, regular rhythm, normal heart sounds and intact distal pulses.   No murmur heard. Pulmonary/Chest: Effort normal and breath sounds normal. No respiratory distress. She has no wheezes.  Abdominal: Soft. She exhibits no distension. There is no tenderness.  Musculoskeletal: She exhibits no edema or tenderness.  Lymphadenopathy:    She has no cervical adenopathy.  Neurological: She is alert and oriented to person, place, and time.  Skin: Skin is warm and dry. No rash noted. No erythema.  Psychiatric: She has a normal mood and affect. Her behavior is normal. Judgment and thought content normal.     Depression Screen PHQ 2/9 Scores 11/11/2015 11/19/2014  PHQ - 2 Score 0 0      Assessment & Plan:   1. Annual physical exam Sees gynecologist for pap smears and mammogram orders.GYN performs breasts exam, pelvic, DRE - CBC w/Diff/Platelet - Comprehensive metabolic panel - TSH - POCT urinalysis dipstick  2. Anxiety and depression - TSH  3. Other hyperlipidemia - Comprehensive metabolic panel - Lipid Panel With  LDL/HDL Ratio  4. Elevated blood pressure reading Elevated today. Will re check on the next visit. Will  bring in home readings on her next visit.  HPI, Exam and A&P transcribed under direction and in the presence of Julieanne Mansonichard Eliyanah Elgersma, MD. I have done the exam and reviewed the chart and it is accurate to the best of my knowledge. Julieanne Mansonichard Jacqulynn Shappell M.D. [facility]

## 2015-12-01 ENCOUNTER — Other Ambulatory Visit: Payer: Self-pay | Admitting: Advanced Practice Midwife

## 2015-12-01 DIAGNOSIS — Z1231 Encounter for screening mammogram for malignant neoplasm of breast: Secondary | ICD-10-CM

## 2016-01-05 ENCOUNTER — Ambulatory Visit
Admission: RE | Admit: 2016-01-05 | Discharge: 2016-01-05 | Disposition: A | Discharge status: Home / Self Care | Payer: 59 | Source: Ambulatory Visit | Attending: Advanced Practice Midwife | Admitting: Advanced Practice Midwife

## 2016-01-05 DIAGNOSIS — Z1231 Encounter for screening mammogram for malignant neoplasm of breast: Secondary | ICD-10-CM | POA: Diagnosis not present

## 2016-03-01 ENCOUNTER — Other Ambulatory Visit: Payer: Self-pay | Admitting: Emergency Medicine

## 2016-03-01 MED ORDER — NEXIUM 40 MG PO CPDR
40.0000 mg | DELAYED_RELEASE_CAPSULE | Freq: Every day | ORAL | 3 refills | Status: DC
Start: 2016-03-01 — End: 2016-03-01

## 2016-03-01 MED ORDER — NEXIUM 40 MG PO CPDR: 40.0000 mg | DELAYED_RELEASE_CAPSULE | Freq: Every day | ORAL | 3 refills | Status: DC

## 2016-03-02 MED ORDER — NEXIUM 40 MG PO CPDR: 40.0000 mg | DELAYED_RELEASE_CAPSULE | Freq: Every day | ORAL | 3 refills | Status: DC

## 2016-03-04 ENCOUNTER — Other Ambulatory Visit: Payer: Self-pay

## 2016-03-04 MED ORDER — ESOMEPRAZOLE MAGNESIUM 40 MG PO CPDR: 40.0000 mg | DELAYED_RELEASE_CAPSULE | Freq: Every day | ORAL | 3 refills | Status: DC

## 2016-12-06 ENCOUNTER — Other Ambulatory Visit: Payer: Self-pay | Admitting: Family Medicine

## 2017-01-20 ENCOUNTER — Encounter: Payer: Self-pay | Admitting: Family Medicine

## 2017-01-20 ENCOUNTER — Ambulatory Visit (INDEPENDENT_AMBULATORY_CARE_PROVIDER_SITE_OTHER): Payer: 59 | Admitting: Family Medicine

## 2017-01-20 VITALS — BP 130/78 | HR 86 | Temp 98.5°F | Ht 67.0 in | Wt 159.0 lb

## 2017-01-20 DIAGNOSIS — F419 Anxiety disorder, unspecified: Secondary | ICD-10-CM

## 2017-01-20 DIAGNOSIS — Z0000 Encounter for general adult medical examination without abnormal findings: Secondary | ICD-10-CM

## 2017-01-20 DIAGNOSIS — Z13820 Encounter for screening for osteoporosis: Secondary | ICD-10-CM

## 2017-01-20 DIAGNOSIS — F329 Major depressive disorder, single episode, unspecified: Secondary | ICD-10-CM | POA: Diagnosis not present

## 2017-01-20 DIAGNOSIS — Z114 Encounter for screening for human immunodeficiency virus [HIV]: Secondary | ICD-10-CM

## 2017-01-20 DIAGNOSIS — Z1231 Encounter for screening mammogram for malignant neoplasm of breast: Secondary | ICD-10-CM | POA: Diagnosis not present

## 2017-01-20 DIAGNOSIS — E7849 Other hyperlipidemia: Secondary | ICD-10-CM | POA: Diagnosis not present

## 2017-01-20 NOTE — Progress Notes (Signed)
Patient: Patty Spencer, Female    DOB: Sep 07, 1955, 61 y.o.   MRN: [id number] Visit Date: 01/20/2017  Today's Provider: Dortha Kernennis Rochell Mabie, PA   Chief Complaint  Patient presents with  . Annual Exam   Subjective:    Annual physical exam Patty Stagerseresa Rickard Macmillan is a 61 y.o. female who presents today for health maintenance and complete physical. She feels well. She reports exercising 2 days per week. She reports she is sleeping well.  -----------------------------------------------------------------   Review of Systems  Constitutional: Negative.   HENT: Positive for congestion.   Eyes: Negative.   Respiratory: Negative.   Cardiovascular: Negative.   Gastrointestinal: Negative.   Endocrine: Negative.   Genitourinary: Negative.   Musculoskeletal: Negative.   Skin: Negative.   Allergic/Immunologic: Negative.   Neurological: Negative.   Hematological: Negative.   Psychiatric/Behavioral: Negative.     Social History      She  reports that  has never smoked. she has never used smokeless tobacco. She reports that she drinks alcohol. She reports that she does not use drugs.       Social History   Socioeconomic History  . Marital status: Married    Spouse name: None  . Number of children: None  . Years of education: None  . Highest education level: None  Social Needs  . Financial resource strain: None  . Food insecurity - worry: None  . Food insecurity - inability: None  . Transportation needs - medical: None  . Transportation needs - non-medical: None  Occupational History  . None  Tobacco Use  . Smoking status: Never Smoker  . Smokeless tobacco: Never Used  Substance and Sexual Activity  . Alcohol use: Yes    Comment: socially  . Drug use: No  . Sexual activity: Yes  Other Topics Concern  . None  Social History Narrative  . None   History reviewed. No pertinent past medical history.  Patient Active Problem List   Diagnosis Date Noted  . Allergic  rhinitis 06/21/2014  . Anxiety and depression 06/21/2014  . Abnormal LFTs 06/21/2014  . Acid reflux 06/21/2014  . Personal history of disease of skin and subcutaneous tissue 06/21/2014  . Elective abortion 06/21/2014  . HLD (hyperlipidemia) 06/21/2014  . Cannot sleep 06/21/2014  . Asymptomatic postmenopausal status 06/21/2014  . H/O peptic ulcer 11/01/2005   Past Surgical History:  Procedure Laterality Date  . ABDOMINAL HYSTERECTOMY    . BREAST BIOPSY Left    neg- core  . BUNIONECTOMY Bilateral   . DILATION AND CURETTAGE OF UTERUS    . hemrroidectomy     Family History        Family Status  Relation Name Status  . Mother  Alive  . Father  Deceased  . Sister  Alive  . Brother  Alive  . Son  Alive  . Brother  Alive  . MGM  (Not Specified)  . Neg Hx  (Not Specified)        Her family history includes Allergic Disorder in her brother; Allergic rhinitis in her sister; Asthma in her sister; CAD in her maternal grandmother; Epilepsy in her brother; Heart attack in her maternal grandmother; Pulmonary fibrosis in her father; Skin cancer in her mother.     Allergies  Allergen Reactions  . Erythromycin Base Other (See Comments)    No description of reaction noted  . Sulfamethoxazole Other (See Comments)    No description of reactions noted  . Codeine Other (  See Comments)    hyperactivity hyperactivity  . Erythromycin Nausea And Vomiting  . Ciprofloxacin Diarrhea  . Lauryl Glucoside Other (See Comments)    positive patch test  . Sulfa Antibiotics Rash    Current Outpatient Medications:  .  ALPRAZolam (XANAX) 0.5 MG tablet, 1/2 to 1 tablet daily as needed, Disp: 30 tablet, Rfl: 5 .  cetirizine (ZYRTEC) 10 MG tablet, Take 10 mg by mouth at bedtime., Disp: , Rfl:  .  esomeprazole (NEXIUM) 40 MG capsule, Take 1 capsule (40 mg total) by mouth daily., Disp: 90 capsule, Rfl: 3 .  FLUoxetine (PROZAC) 10 MG capsule, TAKE 1 CAPSULE (10 MG TOTAL) BY MOUTH DAILY., Disp: 90 capsule,  Rfl: 0   Patient Care Team: Maple HudsonGilbert, Richard L Jr., MD as PCP - General (Family Medicine)      Objective:   Vitals: BP 130/78 (BP Location: Right Arm, Patient Position: Sitting, Cuff Size: Normal)   Pulse 86   Temp 98.5 F (36.9 C) (Oral)   Ht 5\' 7"  (1.702 m)   Wt 159 lb (72.1 kg)   SpO2 97%   BMI 24.90 kg/m   Physical Exam  Constitutional: She is oriented to person, place, and time. She appears well-developed and well-nourished.  HENT:  Head: Normocephalic and atraumatic.  Right Ear: External ear normal.  Left Ear: External ear normal.  Nose: Nose normal.  Mouth/Throat: Oropharynx is clear and moist.  Eyes: Conjunctivae and EOM are normal. Pupils are equal, round, and reactive to light. Right eye exhibits no discharge.  Neck: Normal range of motion. Neck supple. No tracheal deviation present. No thyromegaly present.  Cardiovascular: Normal rate, regular rhythm, normal heart sounds and intact distal pulses.  No murmur heard. Pulmonary/Chest: Effort normal and breath sounds normal. No respiratory distress. She has no wheezes. She has no rales. She exhibits no tenderness.  Abdominal: Soft. She exhibits no distension and no mass. There is no tenderness. There is no rebound and no guarding.  Genitourinary:  Genitourinary Comments: Deferred to GYN.  Musculoskeletal: Normal range of motion. She exhibits no edema or tenderness.  Lymphadenopathy:    She has no cervical adenopathy.  Neurological: She is alert and oriented to person, place, and time. She has normal reflexes. No cranial nerve deficit. She exhibits normal muscle tone. Coordination normal.  Skin: Skin is warm and dry. No rash noted. No erythema.  Psychiatric: She has a normal mood and affect. Her behavior is normal. Judgment and thought content normal.    Depression Screen PHQ 2/9 Scores 11/11/2015 11/19/2014  PHQ - 2 Score 0 0    Assessment & Plan:     Routine Health Maintenance and Physical Exam  Exercise  Activities and Dietary recommendations Goals    Encouraged to exercise regularly and follow low fat diet.      Immunization History  Administered Date(s) Administered  . Influenza Whole 11/03/2015  . Influenza-Unspecified 11/14/2016  . Td 03/28/2003  . Tdap 09/28/2011    Health Maintenance  Topic Date Due  . HIV Screening  05/29/1971  . PAP SMEAR  03/03/2016  . MAMMOGRAM  01/04/2018  . COLONOSCOPY  02/21/2019  . TETANUS/TDAP  09/27/2021  . INFLUENZA VACCINE  Completed  . Hepatitis C Screening  Completed     Discussed health benefits of physical activity, and encouraged her to engage in regular exercise appropriate for her age and condition.    -------------------------------------------------------------------- 1. Annual physical exam General health good. Immunizations up to date. Counseled regarding shingrix and she will  check with her pharmacy about availability. Will see her GYN after the first of the year for PAP and pelvic exam. Check routine labs fasting after New Year's Day. - CBC with Differential/Platelet; Future - Lipid panel; Future - TSH; Future - COMPLETE METABOLIC PANEL WITH GFR; Future  2. Other hyperlipidemia Last lipid panel in 2015 showed LDL 171. Will recheck labs. Presently on no statins. - Lipid panel; Future - TSH; Future - COMPLETE METABOLIC PANEL WITH GFR; Future  3. Anxiety and depression Stable on Alprazolam 0.5 mg 1/2-1 tablet qd prn and fluoxetine 10 mg qd. Recheck labs and follow up pending reports. - CBC with Differential/Platelet; Future - TSH; Future - COMPLETE METABOLIC PANEL WITH GFR; Future  4. Breast cancer screening by mammogram - MM Digital Screening; Future  5. Screening for osteoporosis Postmenopausal. Never a smoker. Schedule BMD test. - DG Bone Density; Future  6. Screening for HIV (human immunodeficiency virus) - HIV antibody; Future    Patty Kern, PA  [facility]

## 2017-02-13 ENCOUNTER — Encounter: Payer: Self-pay | Admitting: Family Medicine

## 2017-03-02 ENCOUNTER — Other Ambulatory Visit: Payer: 59

## 2017-03-11 ENCOUNTER — Other Ambulatory Visit: Payer: Self-pay | Admitting: Family Medicine

## 2017-09-03 ENCOUNTER — Other Ambulatory Visit: Payer: Self-pay | Admitting: Family Medicine

## 2017-11-27 ENCOUNTER — Other Ambulatory Visit: Payer: Self-pay | Admitting: Family Medicine

## 2017-12-21 ENCOUNTER — Other Ambulatory Visit: Payer: Self-pay | Admitting: Family Medicine

## 2018-02-21 ENCOUNTER — Other Ambulatory Visit: Payer: Self-pay | Admitting: Family Medicine

## 2018-03-20 ENCOUNTER — Other Ambulatory Visit: Payer: Self-pay | Admitting: Family Medicine

## 2018-04-18 ENCOUNTER — Other Ambulatory Visit: Payer: Self-pay | Admitting: Family Medicine

## 2018-04-18 NOTE — Telephone Encounter (Signed)
Pharmacy requesting refills. Thanks!  

## 2018-04-25 ENCOUNTER — Other Ambulatory Visit: Payer: Self-pay | Admitting: Family Medicine

## 2018-04-25 NOTE — Telephone Encounter (Signed)
Schedule virtual appt--not seen since 2017

## 2018-04-25 NOTE — Telephone Encounter (Signed)
Please review patient has not been seen since 2018

## 2018-04-26 ENCOUNTER — Other Ambulatory Visit: Payer: Self-pay

## 2018-04-26 ENCOUNTER — Ambulatory Visit (INDEPENDENT_AMBULATORY_CARE_PROVIDER_SITE_OTHER): Payer: 59 | Admitting: Family Medicine

## 2018-04-26 ENCOUNTER — Encounter: Payer: 59 | Admitting: Family Medicine

## 2018-04-26 DIAGNOSIS — F419 Anxiety disorder, unspecified: Secondary | ICD-10-CM

## 2018-04-26 DIAGNOSIS — F329 Major depressive disorder, single episode, unspecified: Secondary | ICD-10-CM | POA: Diagnosis not present

## 2018-04-26 DIAGNOSIS — J309 Allergic rhinitis, unspecified: Secondary | ICD-10-CM | POA: Diagnosis not present

## 2018-04-26 MED ORDER — FLUOXETINE HCL 10 MG PO CAPS: 10.0000 mg | ORAL_CAPSULE | Freq: Every day | ORAL | 11 refills | Status: DC

## 2018-04-26 NOTE — Progress Notes (Signed)
Patient: Patty Spencer Female    DOB: [birth year]   63 y.o.   MRN: [id number] Visit Date: 04/26/2018  Today's Provider: Megan Mans, MD   No chief complaint on file.  Subjective:     Pleasant 63 year old has telemedicine visit due to covid outbreak.  She had a physical with Dortha Kern December 2018 but I have not seen her for depression since 2017.  She has been stable on Prozac for many years.  She continues to be stable despite the stress of this outbreak on all of society.  She is self quarantining with her husband and her son and his wife.  [place] is at the beach.  Again, her depression is doing very well.  She is not homicidal suicidal or depressed.  She is stable.  She is in complete remission. The only other issue she has today is 1 of allergies.  They have a house, beach house, a lake house, all of which you have had renovations recently.  She thinks she has figured out she is allergic to sheet rock dust.  She probably is not has both of upper respiratory symptoms but especially skin symptoms which are bothering her a lot recently.  I saw her hand rash and she has some mild to moderate atopic dermatitis.     Virtual Visit via Video Note  I connected with Patty Spencer on 04/26/18 at  62:00 PM EDT by a video enabled telemedicine application and verified that I am speaking with the correct person using two identifiers.   I discussed the limitations of evaluation and management by telemedicine and the availability of in person appointments. The patient expressed understanding and agreed to proceed.   Allergies  Allergen Reactions  . Erythromycin Base Other (See Comments)    No description of reaction noted  . Sulfamethoxazole Other (See Comments)    No description of reactions noted  . Codeine Other (See Comments)    hyperactivity hyperactivity  . Erythromycin Nausea And Vomiting  . Ciprofloxacin Diarrhea  . Lauryl Glucoside Other (See Comments)   positive patch test  . Sulfa Antibiotics Rash     Current Outpatient Medications:  .  ALPRAZolam (XANAX) 0.5 MG tablet, 1/2 to 1 tablet daily as needed, Disp: 30 tablet, Rfl: 5 .  cetirizine (ZYRTEC) 10 MG tablet, Take 10 mg by mouth at bedtime., Disp: , Rfl:  .  esomeprazole (NEXIUM) 40 MG capsule, TAKE 1 CAPSULE (40 MG TOTAL) BY MOUTH DAILY., Disp: 90 capsule, Rfl: 3 .  FLUoxetine (PROZAC) 10 MG capsule, TAKE 1 CAPSULE BY MOUTH EVERY DAY, Disp: 30 capsule, Rfl: 0  Review of Systems  Constitutional: Negative.   HENT: Positive for congestion and postnasal drip.   Respiratory: Negative.   Cardiovascular: Negative.   Psychiatric/Behavioral: Negative.     Social History   Tobacco Use  . Smoking status: Never Smoker  . Smokeless tobacco: Never Used  Substance Use Topics  . Alcohol use: Yes    Comment: socially      Objective:   There were no vitals taken for this visit. There were no vitals filed for this visit.   Physical Exam Constitutional:      Appearance: Normal appearance.  HENT:     Head: Normocephalic and atraumatic.     Right Ear: External ear normal.     Left Ear: External ear normal.     Nose: Nose normal.  Pulmonary:     Effort: Pulmonary effort is  normal.  Skin:    Comments: Atopic dermatitis of the left hand  Neurological:     Mental Status: She is alert. Mental status is at baseline.  Psychiatric:        Mood and Affect: Mood normal.        Behavior: Behavior normal.        Thought Content: Thought content normal.        Judgment: Judgment normal.         Assessment & Plan    1. Allergic rhinitis, unspecified seasonality, unspecified trigger Continue Xyzal daily.  Consider steroid taper but could use topical steroids for the atopic dermatitis.  2. Anxiety and depression Well-controlled.  Continue Prozac which will be refilled today.  I will see her back within a year. She will need health maintenance later on this year as it has been a year  and a half that she has had this. Follow Up Instructions: Continue Xyzal for allergies.  Refill fluoxetine.   I discussed the assessment and treatment plan with the patient. The patient was provided an opportunity to ask questions and all were answered. The patient agreed with the plan and demonstrated an understanding of the instructions.   The patient was advised to call back or seek an in-person evaluation if the symptoms worsen or if the condition fails to improve as anticipated.  I provided 12 minutes of non-face-to-face time during this encounter.     I have done the exam and reviewed the above chart and it is accurate to the best of my knowledge. Dentist has been used in this note in any air is in the dictation or transcription are unintentional.  Megan Mans, MD  [facility]

## 2018-04-26 NOTE — Progress Notes (Deleted)
       Patient: Patty Spencer Female    DOB: 10/15/1955   63 y.o.   MRN: [id number] Visit Date: 04/26/2018  Today's Provider: Megan Mans, MD   No chief complaint on file.  Subjective:     HPI  Allergies  Allergen Reactions  . Erythromycin Base Other (See Comments)    No description of reaction noted  . Sulfamethoxazole Other (See Comments)    No description of reactions noted  . Codeine Other (See Comments)    hyperactivity hyperactivity  . Erythromycin Nausea And Vomiting  . Ciprofloxacin Diarrhea  . Lauryl Glucoside Other (See Comments)    positive patch test  . Sulfa Antibiotics Rash     Current Outpatient Medications:  .  ALPRAZolam (XANAX) 0.5 MG tablet, 1/2 to 1 tablet daily as needed, Disp: 30 tablet, Rfl: 5 .  cetirizine (ZYRTEC) 10 MG tablet, Take 10 mg by mouth at bedtime., Disp: , Rfl:  .  esomeprazole (NEXIUM) 40 MG capsule, TAKE 1 CAPSULE (40 MG TOTAL) BY MOUTH DAILY., Disp: 90 capsule, Rfl: 3 .  FLUoxetine (PROZAC) 10 MG capsule, TAKE 1 CAPSULE BY MOUTH EVERY DAY, Disp: 30 capsule, Rfl: 0  Review of Systems  Social History   Tobacco Use  . Smoking status: Never Smoker  . Smokeless tobacco: Never Used  Substance Use Topics  . Alcohol use: Yes    Comment: socially      Objective:   There were no vitals taken for this visit. There were no vitals filed for this visit.   Physical Exam      Assessment & Plan        Megan Mans, MD  [facility]

## 2018-04-26 NOTE — Telephone Encounter (Signed)
Updated pharmacy. Please review. Thanks!

## 2018-04-30 NOTE — Progress Notes (Signed)
This encounter was created in error - please disregard.

## 2018-11-06 ENCOUNTER — Other Ambulatory Visit: Payer: Self-pay | Admitting: Family Medicine

## 2018-11-21 ENCOUNTER — Encounter: Payer: Self-pay | Admitting: Family Medicine

## 2019-04-27 ENCOUNTER — Other Ambulatory Visit: Payer: Self-pay | Admitting: Family Medicine

## 2019-04-27 NOTE — Telephone Encounter (Signed)
Requested medications are due for refill today? Yes  Requested medications are on active medication list?  Yes  Last Refill:  04/26/2018   # 30 with 11 refills   Future visit scheduled?  no  Notes to Clinic:  Medication failed protocol due to no PHQ-9 in past 360 days and no office visit in one year.

## 2019-07-01 ENCOUNTER — Ambulatory Visit
Admission: EM | Admit: 2019-07-01 | Discharge: 2019-07-01 | Disposition: A | Discharge status: Home / Self Care | Payer: 59 | Attending: Emergency Medicine | Admitting: Emergency Medicine

## 2019-07-01 DIAGNOSIS — J069 Acute upper respiratory infection, unspecified: Secondary | ICD-10-CM | POA: Diagnosis present

## 2019-07-01 DIAGNOSIS — J029 Acute pharyngitis, unspecified: Secondary | ICD-10-CM | POA: Diagnosis present

## 2019-07-01 LAB — POCT RAPID STREP A (OFFICE): Rapid Strep A Screen: NEGATIVE

## 2019-07-01 LAB — POC SARS CORONAVIRUS 2 AG -  ED: SARS Coronavirus 2 Ag: NEGATIVE

## 2019-07-01 LAB — POCT INFLUENZA A/B
Influenza A, POC: NEGATIVE
Influenza B, POC: NEGATIVE

## 2019-07-01 NOTE — ED Triage Notes (Signed)
C/o sore throat, cough, and hoarseness. Started Saturday evening right before she went to sleep. Reports fever reached 102.8 at the highest.

## 2019-07-01 NOTE — Discharge Instructions (Addendum)
Your rapid strep test is negative.  A throat culture is pending; we will call you if it is positive requiring treatment.    Your rapid flu tests are negative.   Your rapid COVID test is negative; the send-out test is pending.  You should self quarantine until your test result is back.    Take Tylenol or ibuprofen as needed for fever or discomfort.   Go to the emergency department if you develop high fever, shortness of breath, severe diarrhea, or other concerning symptoms.

## 2019-07-01 NOTE — ED Provider Notes (Addendum)
Roderic Palau    CSN: [id number] Arrival date & time: 07/01/19  0941      History   Chief Complaint Chief Complaint  Patient presents with  . Sore Throat  . Cough  . Chills  . Fever    HPI Patty Spencer is a 64 y.o. female.   Patient presents with sore throat, hoarse, nonproductive cough, postnasal drip x2 days.  T-max 102.8.  She states her husband was sick with similar symptoms last week but did not have a fever.  She has treated her symptoms at home with Tylenol and Mucinex.  She denies shortness of breath, vomiting, diarrhea, rash, or other symptoms.    The history is provided by the patient.    History reviewed. No pertinent past medical history.  Patient Active Problem List   Diagnosis Date Noted  . Allergic rhinitis 06/21/2014  . Anxiety and depression 06/21/2014  . Acid reflux 06/21/2014  . Personal history of disease of skin and subcutaneous tissue 06/21/2014  . HLD (hyperlipidemia) 06/21/2014  . Asymptomatic postmenopausal status 06/21/2014  . H/O peptic ulcer 11/01/2005    Past Surgical History:  Procedure Laterality Date  . ABDOMINAL HYSTERECTOMY    . BREAST BIOPSY Left    neg- core  . BUNIONECTOMY Bilateral   . DILATION AND CURETTAGE OF UTERUS    . hemrroidectomy      OB History   No obstetric history on file.      Home Medications    Prior to Admission medications   Medication Sig Start Date End Date Taking? Authorizing Provider  ALPRAZolam Duanne Moron) 0.5 MG tablet 1/2 to 1 tablet daily as needed 11/11/15   Jerrol Banana., MD  cetirizine (ZYRTEC) 10 MG tablet Take 10 mg by mouth at bedtime.    [provider]  esomeprazole (NEXIUM) 40 MG capsule TAKE 1 CAPSULE (40 MG TOTAL) BY MOUTH DAILY. 11/06/18   Jerrol Banana., MD  FLUoxetine (PROZAC) 10 MG capsule TAKE 1 CAPSULE BY MOUTH EVERY DAY 04/29/19   Jerrol Banana., MD    Family History Family History  Problem Relation Age of Onset  . Skin cancer  Mother   . Pulmonary fibrosis Father   . Asthma Sister   . Allergic rhinitis Sister   . Epilepsy Brother   . Allergic Disorder Brother   . CAD Maternal Grandmother   . Heart attack Maternal Grandmother   . Breast cancer Neg Hx     Social History Social History   Tobacco Use  . Smoking status: Never Smoker  . Smokeless tobacco: Never Used  Substance Use Topics  . Alcohol use: Yes    Comment: socially  . Drug use: No     Allergies   Erythromycin base, Sulfamethoxazole, Codeine, Erythromycin, Ciprofloxacin, Lauryl glucoside, and Sulfa antibiotics   Review of Systems Review of Systems  Constitutional: Positive for fever. Negative for chills.  HENT: Positive for postnasal drip, sore throat and voice change. Negative for ear pain.   Eyes: Negative for pain and visual disturbance.  Respiratory: Negative for cough and shortness of breath.   Cardiovascular: Negative for chest pain and palpitations.  Gastrointestinal: Negative for abdominal pain, diarrhea, nausea and vomiting.  Genitourinary: Negative for dysuria and hematuria.  Musculoskeletal: Negative for arthralgias and back pain.  Skin: Negative for color change and rash.  Neurological: Negative for seizures and syncope.  All other systems reviewed and are negative.    Physical Exam Triage Vital Signs ED Triage  Vitals  Enc Vitals Group     BP      Pulse      Resp      Temp      Temp src      SpO2      Weight      Height      Head Circumference      Peak Flow      Pain Score      Pain Loc      Pain Edu?      Excl. in GC?    No data found.  Updated Vital Signs BP (!) 150/90 (BP Location: Left Arm)   Pulse (!) 102   Temp 98.8 F (37.1 C) (Oral)   Resp 17   SpO2 96%   Visual Acuity Right Eye Distance:   Left Eye Distance:   Bilateral Distance:    Right Eye Near:   Left Eye Near:    Bilateral Near:     Physical Exam Vitals and nursing note reviewed.  Constitutional:      General: She is not  in acute distress.    Appearance: She is well-developed.  HENT:     Head: Normocephalic and atraumatic.     Right Ear: Tympanic membrane normal.     Left Ear: Tympanic membrane normal.     Nose: Nose normal.     Mouth/Throat:     Mouth: Mucous membranes are moist.     Pharynx: Posterior oropharyngeal erythema present.     Comments: Voice hoarse. Eyes:     Conjunctiva/sclera: Conjunctivae normal.  Cardiovascular:     Rate and Rhythm: Normal rate and regular rhythm.     Heart sounds: No murmur.  Pulmonary:     Effort: Pulmonary effort is normal. No respiratory distress.     Breath sounds: Normal breath sounds. No wheezing or rhonchi.  Abdominal:     Palpations: Abdomen is soft.     Tenderness: There is no abdominal tenderness. There is no guarding or rebound.  Musculoskeletal:     Cervical back: Neck supple.  Skin:    General: Skin is warm and dry.     Findings: No rash.  Neurological:     General: No focal deficit present.     Mental Status: She is alert and oriented to person, place, and time.     Gait: Gait normal.  Psychiatric:        Mood and Affect: Mood normal.        Behavior: Behavior normal.      UC Treatments / Results  Labs (all labs ordered are listed, but only abnormal results are displayed) Labs Reviewed  CULTURE, GROUP A STREP ([facility])  NOVEL CORONAVIRUS, NAA  POC SARS CORONAVIRUS 2 AG -  ED  POCT RAPID STREP A (OFFICE)  POCT INFLUENZA A/B    EKG   Radiology No results found.  Procedures Procedures (including critical care time)  Medications Ordered in UC Medications - No data to display  Initial Impression / Assessment and Plan / UC Course  I have reviewed the triage vital signs and the nursing notes.  Pertinent labs & imaging results that were available during my care of the patient were reviewed by me and considered in my medical decision making (see chart for details).   Viral upper respiratory infection with cough, sore throat.  Rapid  flu negative.  Rapid strep negative; throat culture pending.  POC COVID negative; PCR pending.  Instructed patient to self quarantine  until the test result is back and to take Tylenol or ibuprofen as needed for fever/discomfort.  Instructed patient to go to the emergency department if she develops high fever, shortness of breath, severe diarrhea, or other concerning symptoms.  Patient agrees with plan of care.    Final Clinical Impressions(s) / UC Diagnoses   Final diagnoses:  Sore throat  Viral URI with cough     Discharge Instructions     Your rapid strep test is negative.  A throat culture is pending; we will call you if it is positive requiring treatment.    Your rapid flu tests are negative.   Your rapid COVID test is negative; the send-out test is pending.  You should self quarantine until your test result is back.    Take Tylenol or ibuprofen as needed for fever or discomfort.   Go to the emergency department if you develop high fever, shortness of breath, severe diarrhea, or other concerning symptoms.       ED Prescriptions    None     PDMP not reviewed this encounter.   Mickie Bail, NP 07/01/19 1014    Mickie Bail, NP 07/01/19 1034

## 2019-07-03 LAB — SARS-COV-2, NAA 2 DAY TAT

## 2019-07-03 LAB — NOVEL CORONAVIRUS, NAA: SARS-CoV-2, NAA: NOT DETECTED

## 2019-07-04 LAB — CULTURE, GROUP A STREP (THRC)

## 2019-07-08 ENCOUNTER — Ambulatory Visit
Admission: EM | Admit: 2019-07-08 | Discharge: 2019-07-08 | Disposition: A | Discharge status: Home / Self Care | Payer: 59 | Attending: Emergency Medicine | Admitting: Emergency Medicine

## 2019-07-08 DIAGNOSIS — J0100 Acute maxillary sinusitis, unspecified: Secondary | ICD-10-CM

## 2019-07-08 MED ORDER — AMOXICILLIN 875 MG PO TABS: 875.0000 mg | ORAL_TABLET | Freq: Two times a day (BID) | ORAL | 0 refills | Status: AC

## 2019-07-08 MED ORDER — BENZONATATE 100 MG PO CAPS
100.0000 mg | ORAL_CAPSULE | Freq: Three times a day (TID) | ORAL | 0 refills | Status: DC | PRN
Start: 2019-07-08 — End: 2019-10-23

## 2019-07-08 NOTE — ED Provider Notes (Signed)
Renaldo Fiddler    CSN: [id number] Arrival date & time: 07/08/19  6073      History   Chief Complaint Chief Complaint  Patient presents with  . Cough    HPI Patty Spencer is a 64 y.o. female.   Patient presents with ongoing nonproductive cough, nasal congestion, postnasal drip, sinus pressure, postnasal drip x 8 days.  She denies fever, chills, rash, shortness of breath, vomiting, diarrhea, or other symptoms.  She has been treating her symptoms at home with Mucinex and Tylenol.  She was seen here on 07/01/2019 and treated symptomatically for sore throat and upper respiratory infection; Negative strep and negative COVID.     The history is provided by the patient.    History reviewed. No pertinent past medical history.  Patient Active Problem List   Diagnosis Date Noted  . Allergic rhinitis 06/21/2014  . Anxiety and depression 06/21/2014  . Acid reflux 06/21/2014  . Personal history of disease of skin and subcutaneous tissue 06/21/2014  . HLD (hyperlipidemia) 06/21/2014  . Asymptomatic postmenopausal status 06/21/2014  . H/O peptic ulcer 11/01/2005    Past Surgical History:  Procedure Laterality Date  . ABDOMINAL HYSTERECTOMY    . BREAST BIOPSY Left    neg- core  . BUNIONECTOMY Bilateral   . DILATION AND CURETTAGE OF UTERUS    . hemrroidectomy      OB History   No obstetric history on file.      Home Medications    Prior to Admission medications   Medication Sig Start Date End Date Taking? Authorizing Provider  ALPRAZolam Prudy Feeler) 0.5 MG tablet 1/2 to 1 tablet daily as needed 11/11/15   Maple Hudson., MD  amoxicillin (AMOXIL) 875 MG tablet Take 1 tablet (875 mg total) by mouth 2 (two) times daily for 10 days. 07/08/19 07/18/19  Mickie Bail, NP  benzonatate (TESSALON) 100 MG capsule Take 1 capsule (100 mg total) by mouth 3 (three) times daily as needed for cough. 07/08/19   Mickie Bail, NP  cetirizine (ZYRTEC) 10 MG tablet Take 10 mg by mouth  at bedtime.    [provider]  esomeprazole (NEXIUM) 40 MG capsule TAKE 1 CAPSULE (40 MG TOTAL) BY MOUTH DAILY. 11/06/18   Maple Hudson., MD  FLUoxetine (PROZAC) 10 MG capsule TAKE 1 CAPSULE BY MOUTH EVERY DAY 04/29/19   Maple Hudson., MD    Family History Family History  Problem Relation Age of Onset  . Skin cancer Mother   . Pulmonary fibrosis Father   . Asthma Sister   . Allergic rhinitis Sister   . Epilepsy Brother   . Allergic Disorder Brother   . CAD Maternal Grandmother   . Heart attack Maternal Grandmother   . Breast cancer Neg Hx     Social History Social History   Tobacco Use  . Smoking status: Never Smoker  . Smokeless tobacco: Never Used  Substance Use Topics  . Alcohol use: Yes    Comment: socially  . Drug use: No     Allergies   Erythromycin base, Sulfamethoxazole, Codeine, Erythromycin, Ciprofloxacin, Lauryl glucoside, and Sulfa antibiotics   Review of Systems Review of Systems  Constitutional: Negative for chills and fever.  HENT: Positive for congestion, postnasal drip, rhinorrhea and sinus pressure. Negative for ear pain and sore throat.   Eyes: Negative for pain and visual disturbance.  Respiratory: Positive for cough. Negative for shortness of breath.   Cardiovascular: Negative for chest pain  and palpitations.  Gastrointestinal: Negative for abdominal pain, diarrhea, nausea and vomiting.  Genitourinary: Negative for dysuria and hematuria.  Musculoskeletal: Negative for arthralgias and back pain.  Skin: Negative for color change and rash.  Neurological: Negative for seizures and syncope.  All other systems reviewed and are negative.    Physical Exam Triage Vital Signs ED Triage Vitals  Enc Vitals Group     BP 07/08/19 0933 (!) 146/84     Pulse Rate 07/08/19 0933 89     Resp 07/08/19 0933 16     Temp 07/08/19 0933 97.7 F (36.5 C)     Temp Source 07/08/19 0933 Temporal     SpO2 07/08/19 0933 96 %     Weight --       Height --      Head Circumference --      Peak Flow --      Pain Score 07/08/19 0931 0     Pain Loc --      Pain Edu? --      Excl. in GC? --    No data found.  Updated Vital Signs BP (!) 146/84 (BP Location: Left Arm)   Pulse 89   Temp 97.7 F (36.5 C) (Temporal)   Resp 16   SpO2 96%   Visual Acuity Right Eye Distance:   Left Eye Distance:   Bilateral Distance:    Right Eye Near:   Left Eye Near:    Bilateral Near:     Physical Exam Vitals and nursing note reviewed.  Constitutional:      General: She is not in acute distress.    Appearance: She is well-developed.  HENT:     Head: Normocephalic and atraumatic.     Right Ear: Tympanic membrane normal.     Left Ear: Tympanic membrane normal.     Nose: Congestion and rhinorrhea present.     Mouth/Throat:     Mouth: Mucous membranes are moist.     Pharynx: Oropharynx is clear.  Eyes:     Conjunctiva/sclera: Conjunctivae normal.  Cardiovascular:     Rate and Rhythm: Normal rate and regular rhythm.     Heart sounds: No murmur.  Pulmonary:     Effort: Pulmonary effort is normal. No respiratory distress.     Breath sounds: Normal breath sounds.  Abdominal:     Palpations: Abdomen is soft.     Tenderness: There is no abdominal tenderness. There is no guarding or rebound.  Musculoskeletal:     Cervical back: Neck supple.  Skin:    General: Skin is warm and dry.     Findings: No rash.  Neurological:     General: No focal deficit present.     Mental Status: She is alert and oriented to person, place, and time.     Gait: Gait normal.  Psychiatric:        Mood and Affect: Mood normal.        Behavior: Behavior normal.      UC Treatments / Results  Labs (all labs ordered are listed, but only abnormal results are displayed) Labs Reviewed - No data to display  EKG   Radiology No results found.  Procedures Procedures (including critical care time)  Medications Ordered in UC Medications - No data to  display  Initial Impression / Assessment and Plan / UC Course  I have reviewed the triage vital signs and the nursing notes.  Pertinent labs & imaging results that were available during my care  of the patient were reviewed by me and considered in my medical decision making (see chart for details).   Acute maxillary sinusitis, cough.  Treating with amoxicillin and Tessalon Perles.  Instructed patient to continue taking Tylenol as needed for discomfort and Mucinex as needed for congestion.  Instructed patient to follow-up with her PCP if her symptoms are not improving.  Patient agrees to plan of care.     Final Clinical Impressions(s) / UC Diagnoses   Final diagnoses:  Acute non-recurrent maxillary sinusitis     Discharge Instructions     Take the amoxicillin and Tessalon Perles as directed.    Follow up with your primary care provider if your symptoms are not improving.       ED Prescriptions    Medication Sig Dispense Auth. Provider   benzonatate (TESSALON) 100 MG capsule Take 1 capsule (100 mg total) by mouth 3 (three) times daily as needed for cough. 21 capsule Sharion Balloon, NP   amoxicillin (AMOXIL) 875 MG tablet Take 1 tablet (875 mg total) by mouth 2 (two) times daily for 10 days. 20 tablet Sharion Balloon, NP     PDMP not reviewed this encounter.   Sharion Balloon, NP 07/08/19 [id number]

## 2019-07-08 NOTE — Discharge Instructions (Signed)
Take the amoxicillin and Tessalon Perles as directed.    Follow up with your primary care provider if your symptoms are not improving.    

## 2019-07-08 NOTE — ED Triage Notes (Signed)
Patient here for follow-up for URI symptoms that started last week. She was seen a last week. Reports a new cough that gets worse at night and more fatigue.

## 2019-07-23 ENCOUNTER — Other Ambulatory Visit: Payer: Self-pay | Admitting: Family Medicine

## 2019-07-23 NOTE — Telephone Encounter (Signed)
Requested medications are due for refill today?  Yes  Requested medications are on active medication list?  Yes  Last Refill:   04/29/2019 # 30 with one refill  Future visit scheduled?  No   Notes to Clinic:  Medication failed RX refill protocol due to no valid encounter in the past 6 months.  Last office visit was 04/26/2018.

## 2019-07-25 NOTE — Telephone Encounter (Signed)
Please advise refill? Patient has not been seen since 04/26/2018.

## 2019-09-18 ENCOUNTER — Other Ambulatory Visit: Payer: Self-pay | Admitting: Family Medicine

## 2019-09-18 NOTE — Telephone Encounter (Signed)
Requested medications are due for refill today?  Yes  Requested medications are on active medication list?  Yes  Last Refill:   624/2021  # 30 with one refill  Future visit scheduled?  No   Notes to Clinic:  Medication failed RX refill protocol due to no valid encounter in the past 6 months.  Last office visit was 04/26/2018.

## 2019-10-22 ENCOUNTER — Other Ambulatory Visit: Payer: Self-pay | Admitting: Family Medicine

## 2019-10-22 NOTE — Progress Notes (Signed)
Inetta Fermo Sarina Robleto,acting as a scribe for Megan Mans, MD.,have documented all relevant documentation on the behalf of Megan Mans, MD,as directed by  Megan Mans, MD while in the presence of Megan Mans, MD.  Established patient visit   Patient: Patty Spencer   DOB: 1955-07-11   64 y.o. Female  MRN: [id number] Visit Date: 10/23/2019  Today's healthcare provider: Megan Mans, MD   Chief Complaint  Patient presents with   Depression   Anxiety   Subjective    HPI   Patient presents today in office for a medication refill on Prozac. Patient was last seen for this 04/26/19.patient says that her anxiety/depression has been going good, she is not having an problems right now and is in good compliance with medications.  She retired at the first of the year and now lives in [facility] with her husband.  They live part-time here in [place]. Anxiety and depression From 04/26/2019-Well-controlled. Continue Prozac which will be refilled today. I will see her back within a year. She will need health maintenance later on this year as it has been a year and a half that she has had this.      Medications: Outpatient Medications Prior to Visit  Medication Sig   ALPRAZolam (XANAX) 0.5 MG tablet 1/2 to 1 tablet daily as needed   cetirizine (ZYRTEC) 10 MG tablet Take 10 mg by mouth at bedtime.   esomeprazole (NEXIUM) 40 MG capsule TAKE 1 CAPSULE (40 MG TOTAL) BY MOUTH DAILY.   FLUoxetine (PROZAC) 10 MG capsule TAKE 1 CAPSULE BY MOUTH EVERY DAY   [DISCONTINUED] benzonatate (TESSALON) 100 MG capsule Take 1 capsule (100 mg total) by mouth 3 (three) times daily as needed for cough. (Patient not taking: Reported on 10/23/2019)   No facility-administered medications prior to visit.    Review of Systems     Objective    BP (!) 142/95 (BP Location: Left Arm, Patient Position: Sitting, Cuff Size: Normal)    Pulse 96    Temp 98.3 F (36.8 C)  (Oral)    Ht 5\' 7"  (1.702 m)    Wt 160 lb 12.8 oz (72.9 kg)    BMI 25.18 kg/m  BP Readings from Last 3 Encounters:  10/23/19 (!) 142/95  07/08/19 (!) 146/84  07/01/19 (!) 150/90   Wt Readings from Last 3 Encounters:  10/23/19 160 lb 12.8 oz (72.9 kg)  01/20/17 159 lb (72.1 kg)  11/11/15 152 lb (68.9 kg)      Physical Exam Vitals reviewed.  Constitutional:      Appearance: Normal appearance.  HENT:     Head: Normocephalic and atraumatic.     Right Ear: External ear normal.     Left Ear: External ear normal.  Eyes:     General: No scleral icterus.    Conjunctiva/sclera: Conjunctivae normal.  Cardiovascular:     Rate and Rhythm: Normal rate and regular rhythm.     Pulses: Normal pulses.     Heart sounds: Normal heart sounds.  Pulmonary:     Effort: Pulmonary effort is normal.     Breath sounds: Normal breath sounds.  Musculoskeletal:     Right lower leg: No edema.     Left lower leg: No edema.  Skin:    General: Skin is warm and dry.  Neurological:     General: No focal deficit present.     Mental Status: She is alert and oriented to person, place,  and time.  Psychiatric:        Mood and Affect: Mood normal.        Behavior: Behavior normal.        Thought Content: Thought content normal.        Judgment: Judgment normal.       No results found for any visits on 10/23/19.  Assessment & Plan     1. Anxiety and depression  - CBC with Differential/Platelet - Comprehensive metabolic panel - TSH - FLUoxetine (PROZAC) 10 MG capsule; TAKE 1 CAPSULE BY MOUTH EVERY DAY  Dispense: 30 capsule; Refill: 5  2. Elevated blood pressure reading Check blood pressures at home and follow-up first the year for physical. - CBC with Differential/Platelet - Comprehensive metabolic panel - TSH  3. Gastroesophageal reflux disease, unspecified whether esophagitis present  - CBC with Differential/Platelet - Comprehensive metabolic panel - TSH - FLUoxetine (PROZAC) 10 MG  capsule; TAKE 1 CAPSULE BY MOUTH EVERY DAY  Dispense: 30 capsule; Refill: 5  4. Other hyperlipidemia    No follow-ups on file.      I, Megan Mans, MD, have reviewed all documentation for this visit. The documentation on 10/26/19 for the exam, diagnosis, procedures, and orders are all accurate and complete.    Richard Wendelyn Breslow, MD  [facility] [phone] (phone) [phone] (fax)  [facility]

## 2019-10-22 NOTE — Telephone Encounter (Signed)
Requested medications are due for refill today?  Yes  Requested medications are on active medication list?  Yes  Last Refill:   09/18/2019  # 30 with no refills  with notation that patient needs to schedule an appointment for future refills.   Future visit scheduled?  No   Notes to Clinic:  Medication failed Rx refill protocol due to no valid encounter in the past 6 months.  Last visit was on 04/26/2018.

## 2019-10-23 ENCOUNTER — Encounter: Payer: Self-pay | Admitting: Family Medicine

## 2019-10-23 ENCOUNTER — Other Ambulatory Visit: Payer: Self-pay

## 2019-10-23 ENCOUNTER — Ambulatory Visit (INDEPENDENT_AMBULATORY_CARE_PROVIDER_SITE_OTHER): Payer: 59 | Admitting: Family Medicine

## 2019-10-23 VITALS — BP 142/95 | HR 96 | Temp 98.3°F | Ht 67.0 in | Wt 160.8 lb

## 2019-10-23 DIAGNOSIS — F419 Anxiety disorder, unspecified: Secondary | ICD-10-CM | POA: Diagnosis not present

## 2019-10-23 DIAGNOSIS — F329 Major depressive disorder, single episode, unspecified: Secondary | ICD-10-CM

## 2019-10-23 DIAGNOSIS — R030 Elevated blood-pressure reading, without diagnosis of hypertension: Secondary | ICD-10-CM

## 2019-10-23 DIAGNOSIS — K219 Gastro-esophageal reflux disease without esophagitis: Secondary | ICD-10-CM | POA: Diagnosis not present

## 2019-10-23 DIAGNOSIS — E7849 Other hyperlipidemia: Secondary | ICD-10-CM

## 2019-10-23 MED ORDER — ESOMEPRAZOLE MAGNESIUM 40 MG PO CPDR: 40.0000 mg | DELAYED_RELEASE_CAPSULE | Freq: Every day | ORAL | 11 refills | Status: DC

## 2019-10-23 MED ORDER — FLUOXETINE HCL 10 MG PO CAPS: ORAL_CAPSULE | ORAL | 5 refills | Status: DC

## 2019-10-24 ENCOUNTER — Telehealth: Payer: Self-pay

## 2019-10-24 NOTE — Telephone Encounter (Signed)
gilCopied from CRM [id number]. Topic: General - Call Back - No Documentation >> Oct 23, 2019  3:33 PM Mcneil, Ja-Kwan wrote: Reason for CRM: Pt stated she was returning the call to [place]. Pt requests call back. [phone]

## 2019-10-25 ENCOUNTER — Other Ambulatory Visit: Payer: Self-pay | Admitting: Family Medicine

## 2019-10-25 DIAGNOSIS — K219 Gastro-esophageal reflux disease without esophagitis: Secondary | ICD-10-CM

## 2019-10-25 NOTE — Telephone Encounter (Signed)
Spoke to patient, I will leave her lab slip for her on lab side.

## 2019-10-26 MED ORDER — ALPRAZOLAM 0.5 MG PO TABS: ORAL_TABLET | ORAL | 4 refills | Status: DC

## 2019-10-26 MED ORDER — ALPRAZOLAM 0.5 MG PO TABS: ORAL_TABLET | ORAL | 5 refills | Status: DC

## 2020-03-25 ENCOUNTER — Encounter: Payer: Self-pay | Admitting: Family Medicine

## 2020-04-15 ENCOUNTER — Other Ambulatory Visit: Payer: Self-pay | Admitting: Family Medicine

## 2020-04-15 DIAGNOSIS — K219 Gastro-esophageal reflux disease without esophagitis: Secondary | ICD-10-CM

## 2020-04-15 DIAGNOSIS — F419 Anxiety disorder, unspecified: Secondary | ICD-10-CM

## 2020-04-15 DIAGNOSIS — F32A Depression, unspecified: Secondary | ICD-10-CM

## 2020-04-15 NOTE — Telephone Encounter (Signed)
Requested Prescriptions  Pending Prescriptions Disp Refills  . FLUoxetine (PROZAC) 10 MG capsule [Pharmacy Med Name: FLUOXETINE HCL 10 MG CAPSULE] 30 capsule 5    Sig: TAKE 1 CAPSULE BY MOUTH EVERY DAY     Psychiatry:  Antidepressants - SSRI Passed - 04/15/2020  1:25 AM      Passed - Completed PHQ-2 or PHQ-9 in the last 360 days      Passed - Valid encounter within last 6 months    Recent Outpatient Visits          5 months ago Anxiety and depression   [facility] Maple Hudson., MD   1 year ago Allergic rhinitis, unspecified seasonality, unspecified trigger   [facility] Maple Hudson., MD   3 years ago Annual physical exam   [facility] [place], [place], [place]   4 years ago Annual physical exam   [facility] Maple Hudson., MD   5 years ago Annual physical exam   [facility] Maple Hudson., MD

## 2020-05-27 ENCOUNTER — Other Ambulatory Visit: Payer: Self-pay | Admitting: Family Medicine

## 2020-05-27 NOTE — Telephone Encounter (Addendum)
Refill request for Alprazolam last filled 04/26/20; no valid encounter in last 6 months; no upcoming appts noted; [place]; [place]; she would have back to the area for an appt; the was seen by Dr Julieanne Manson, [facility]; will route to office for final disposition.  Requested medication (s) are due for refill today: yes  Requested medication (s) are on the active medication list: yes  Last refill: 04/16/20  Future visit scheduled:  Notes to clinic:  not delegated

## 2020-05-28 ENCOUNTER — Other Ambulatory Visit: Payer: Self-pay | Admitting: Family Medicine

## 2020-05-28 DIAGNOSIS — K219 Gastro-esophageal reflux disease without esophagitis: Secondary | ICD-10-CM

## 2020-08-20 ENCOUNTER — Telehealth: Payer: Self-pay | Admitting: Family Medicine

## 2020-08-20 MED ORDER — MOLNUPIRAVIR EUA 200MG CAPSULE
4.0000 | ORAL_CAPSULE | Freq: Two times a day (BID) | ORAL | 0 refills | Status: AC
Start: 2020-08-20 — End: 2020-08-25

## 2020-08-20 NOTE — Telephone Encounter (Signed)
Molnupiravir 4 capsule BID for 5 days.

## 2020-08-20 NOTE — Telephone Encounter (Signed)
Pt is calling and took in home covid test that is positive and would like to know if dr Sullivan Lone will call in molnupiravir treatment like he did for her husband reginald Lolli. [facility] [address] [employer] [place] phone number [phone]

## 2020-08-20 NOTE — Telephone Encounter (Signed)
Medication sent into the pharmacy. Patient advised.  

## 2020-08-20 NOTE — Telephone Encounter (Signed)
Please advise 

## 2020-10-25 ENCOUNTER — Other Ambulatory Visit: Payer: Self-pay | Admitting: Family Medicine

## 2020-10-25 DIAGNOSIS — K219 Gastro-esophageal reflux disease without esophagitis: Secondary | ICD-10-CM

## 2020-10-25 DIAGNOSIS — F419 Anxiety disorder, unspecified: Secondary | ICD-10-CM

## 2020-10-25 NOTE — Telephone Encounter (Signed)
Requested medication (s) are due for refill today  Yes  Requested medication (s) are on the active medication list  Yes  Future visit scheduled No  LOV 10/23/19  Note to clinic-Spoke with patient today, she was driving. She will call and schedule OV for medication refills. Routing to physician for review.    Requested Prescriptions  Pending Prescriptions Disp Refills   FLUoxetine (PROZAC) 10 MG capsule [Pharmacy Med Name: FLUOXETINE HCL 10 MG CAPSULE] 30 capsule 4    Sig: TAKE 1 CAPSULE BY MOUTH EVERY DAY     Psychiatry:  Antidepressants - SSRI Failed - 10/25/2020 12:45 AM      Failed - Completed PHQ-2 or PHQ-9 in the last 360 days      Failed - Valid encounter within last 6 months    Recent Outpatient Visits           1 year ago Anxiety and depression   [facility] Maple Hudson., MD   2 years ago Allergic rhinitis, unspecified seasonality, unspecified trigger   [facility] Maple Hudson., MD   3 years ago Annual physical exam   [facility] Chrismon, Jodell Cipro, PA-C   4 years ago Annual physical exam   [facility] Maple Hudson., MD   5 years ago Annual physical exam   [facility] Maple Hudson., MD               esomeprazole (NEXIUM) 40 MG capsule [Pharmacy Med Name: ESOMEPRAZOLE MAG DR 40 MG CAP] 30 capsule 4    Sig: TAKE 1 CAPSULE (40 MG TOTAL) BY MOUTH DAILY.     Gastroenterology: Proton Pump Inhibitors Failed - 10/25/2020 12:45 AM      Failed - Valid encounter within last 12 months    Recent Outpatient Visits           1 year ago Anxiety and depression   [facility] Maple Hudson., MD   2 years ago Allergic rhinitis, unspecified seasonality, unspecified trigger   [facility] Maple Hudson., MD   3 years ago Annual physical exam   [facility] Chrismon, Jodell Cipro, [place]   4 years ago Annual  physical exam   [facility] Maple Hudson., MD   5 years ago Annual physical exam   [facility] Maple Hudson., MD

## 2022-03-22 LAB — EXTERNAL GENERIC LAB PROCEDURE: COLOGUARD: NEGATIVE

## 2022-03-22 LAB — COLOGUARD: COLOGUARD: NEGATIVE
